# Patient Record
Sex: Male | Born: 1950 | Race: White | Hispanic: No | Marital: Married | Smoking: Current every day smoker
Health system: Southern US, Community
[De-identification: ages and names within clinical notes are randomized; demographics above are authoritative.]

## PROBLEM LIST (undated history)

## (undated) DIAGNOSIS — E785 Hyperlipidemia, unspecified: Secondary | ICD-10-CM

## (undated) DIAGNOSIS — I10 Essential (primary) hypertension: Secondary | ICD-10-CM

## (undated) DIAGNOSIS — I639 Cerebral infarction, unspecified: Secondary | ICD-10-CM

## (undated) HISTORY — DX: Hyperlipidemia, unspecified: E78.5

## (undated) HISTORY — PX: BACK SURGERY: SHX140

## (undated) HISTORY — DX: Cerebral infarction, unspecified: I63.9

---

## 2002-02-15 ENCOUNTER — Encounter: Admission: RE | Admit: 2002-02-15 | Discharge: 2002-02-15 | Payer: Self-pay | Admitting: Family Medicine

## 2002-02-15 ENCOUNTER — Encounter: Payer: Self-pay | Admitting: Family Medicine

## 2006-10-06 ENCOUNTER — Encounter: Admission: RE | Admit: 2006-10-06 | Discharge: 2006-10-06 | Payer: Self-pay | Admitting: Occupational Medicine

## 2008-10-04 ENCOUNTER — Encounter: Admission: RE | Admit: 2008-10-04 | Discharge: 2008-10-04 | Payer: Self-pay | Admitting: Occupational Medicine

## 2009-12-13 ENCOUNTER — Inpatient Hospital Stay (HOSPITAL_COMMUNITY): Admission: AD | Admit: 2009-12-13 | Discharge: 2009-12-14 | Payer: Self-pay | Admitting: Specialist

## 2009-12-14 ENCOUNTER — Encounter (INDEPENDENT_AMBULATORY_CARE_PROVIDER_SITE_OTHER): Payer: Self-pay | Admitting: Specialist

## 2010-12-16 LAB — URINE MICROSCOPIC-ADD ON

## 2010-12-16 LAB — CBC
Hemoglobin: 16.5 g/dL (ref 13.0–17.0)
MCV: 90.6 fL (ref 78.0–100.0)
RBC: 5.33 MIL/uL (ref 4.22–5.81)

## 2010-12-16 LAB — COMPREHENSIVE METABOLIC PANEL
ALT: 36 U/L (ref 0–53)
AST: 28 U/L (ref 0–37)
CO2: 29 mEq/L (ref 19–32)
Calcium: 9.6 mg/dL (ref 8.4–10.5)
Chloride: 95 mEq/L — ABNORMAL LOW (ref 96–112)
Glucose, Bld: 102 mg/dL — ABNORMAL HIGH (ref 70–99)
Potassium: 3.7 mEq/L (ref 3.5–5.1)
Sodium: 134 mEq/L — ABNORMAL LOW (ref 135–145)

## 2010-12-16 LAB — URINALYSIS, ROUTINE W REFLEX MICROSCOPIC
Glucose, UA: NEGATIVE mg/dL
Hgb urine dipstick: NEGATIVE
Ketones, ur: NEGATIVE mg/dL
Urobilinogen, UA: 0.2 mg/dL (ref 0.0–1.0)
pH: 7.5 (ref 5.0–8.0)

## 2012-05-02 ENCOUNTER — Encounter (HOSPITAL_COMMUNITY): Payer: Self-pay | Admitting: Anesthesiology

## 2012-05-02 ENCOUNTER — Encounter (HOSPITAL_COMMUNITY): Admission: EM | Disposition: A | Discharge status: Home / Self Care | Payer: Self-pay | Source: Home / Self Care

## 2012-05-02 ENCOUNTER — Emergency Department (HOSPITAL_COMMUNITY)
Admission: EM | Admit: 2012-05-02 | Discharge: 2012-05-02 | Disposition: A | Discharge status: Home / Self Care | Payer: BC Managed Care – PPO | Source: Home / Self Care | Attending: Emergency Medicine | Admitting: Emergency Medicine

## 2012-05-02 ENCOUNTER — Ambulatory Visit (HOSPITAL_COMMUNITY)
Admission: EM | Admit: 2012-05-02 | Discharge: 2012-05-02 | Disposition: A | Discharge status: Home / Self Care | Payer: BC Managed Care – PPO | Attending: Urology | Admitting: Urology

## 2012-05-02 ENCOUNTER — Emergency Department (HOSPITAL_COMMUNITY): Payer: BC Managed Care – PPO

## 2012-05-02 ENCOUNTER — Encounter (HOSPITAL_COMMUNITY): Payer: Self-pay | Admitting: *Deleted

## 2012-05-02 ENCOUNTER — Emergency Department (HOSPITAL_COMMUNITY): Payer: BC Managed Care – PPO | Admitting: Anesthesiology

## 2012-05-02 DIAGNOSIS — T190XXA Foreign body in urethra, initial encounter: Secondary | ICD-10-CM | POA: Insufficient documentation

## 2012-05-02 DIAGNOSIS — IMO0002 Reserved for concepts with insufficient information to code with codable children: Secondary | ICD-10-CM | POA: Insufficient documentation

## 2012-05-02 DIAGNOSIS — T191XXA Foreign body in bladder, initial encounter: Secondary | ICD-10-CM

## 2012-05-02 DIAGNOSIS — I10 Essential (primary) hypertension: Secondary | ICD-10-CM | POA: Insufficient documentation

## 2012-05-02 DIAGNOSIS — R310 Gross hematuria: Secondary | ICD-10-CM | POA: Insufficient documentation

## 2012-05-02 HISTORY — DX: Essential (primary) hypertension: I10

## 2012-05-02 HISTORY — PX: CYSTOSCOPY: SHX5120

## 2012-05-02 LAB — URINE MICROSCOPIC-ADD ON

## 2012-05-02 LAB — URINALYSIS, ROUTINE W REFLEX MICROSCOPIC
Bilirubin Urine: NEGATIVE
Glucose, UA: NEGATIVE mg/dL
Ketones, ur: NEGATIVE mg/dL
Leukocytes, UA: NEGATIVE
Nitrite: NEGATIVE
Protein, ur: NEGATIVE mg/dL
pH: 7 (ref 5.0–8.0)

## 2012-05-02 LAB — CBC WITH DIFFERENTIAL/PLATELET
Basophils Absolute: 0 10*3/uL (ref 0.0–0.1)
Eosinophils Absolute: 0.1 10*3/uL (ref 0.0–0.7)
Hemoglobin: 15.5 g/dL (ref 13.0–17.0)
MCH: 30.6 pg (ref 26.0–34.0)
Monocytes Absolute: 0.5 10*3/uL (ref 0.1–1.0)
Monocytes Relative: 4 % (ref 3–12)
Neutrophils Relative %: 84 % — ABNORMAL HIGH (ref 43–77)
Platelets: 278 10*3/uL (ref 150–400)
RBC: 5.06 MIL/uL (ref 4.22–5.81)
RDW: 13 % (ref 11.5–15.5)

## 2012-05-02 LAB — BASIC METABOLIC PANEL
BUN: 9 mg/dL (ref 6–23)
GFR calc Af Amer: >90 mL/min (ref >90)
GFR calc non Af Amer: >90 mL/min (ref >90)
Glucose, Bld: 99 mg/dL (ref 70–99)

## 2012-05-02 SURGERY — CYSTOSCOPY
Anesthesia: General | Site: Bladder | Wound class: Clean Contaminated

## 2012-05-02 MED ORDER — STERILE WATER FOR IRRIGATION IR SOLN
Status: DC | PRN
  Administered 2012-05-02: 3000 mL

## 2012-05-02 MED ORDER — LIDOCAINE HCL (CARDIAC) 20 MG/ML IV SOLN
INTRAVENOUS | Status: DC | PRN
  Administered 2012-05-02: 100 mg via INTRAVENOUS

## 2012-05-02 MED ORDER — PROMETHAZINE HCL 25 MG/ML IJ SOLN: 6.2500 mg | INTRAMUSCULAR | Status: DC | PRN

## 2012-05-02 MED ORDER — 0.9 % SODIUM CHLORIDE (POUR BTL) OPTIME
TOPICAL | Status: DC | PRN
  Administered 2012-05-02: 1000 mL

## 2012-05-02 MED ORDER — FENTANYL CITRATE 0.05 MG/ML IJ SOLN
INTRAMUSCULAR | Status: DC | PRN
  Administered 2012-05-02: 50 ug via INTRAVENOUS

## 2012-05-02 MED ORDER — PROPOFOL 10 MG/ML IV EMUL
INTRAVENOUS | Status: DC | PRN
  Administered 2012-05-02: 200 mg via INTRAVENOUS

## 2012-05-02 MED ORDER — FENTANYL CITRATE 0.05 MG/ML IJ SOLN: 25.0000 ug | INTRAMUSCULAR | Status: DC | PRN

## 2012-05-02 MED ORDER — LACTATED RINGERS IV SOLN: INTRAVENOUS | Status: DC

## 2012-05-02 MED ORDER — LACTATED RINGERS IV SOLN
INTRAVENOUS | Status: DC | PRN
  Administered 2012-05-02: 17:00:00 via INTRAVENOUS

## 2012-05-02 MED ORDER — SULFAMETHOXAZOLE-TRIMETHOPRIM 400-80 MG PO TABS: 1.0000 | ORAL_TABLET | Freq: Two times a day (BID) | ORAL | Status: AC

## 2012-05-02 MED ORDER — CEFAZOLIN SODIUM-DEXTROSE 2-3 GM-% IV SOLR
INTRAVENOUS | Status: AC
  Filled 2012-05-02: qty 50

## 2012-05-02 MED ORDER — TRAMADOL HCL 50 MG PO TABS: 50.0000 mg | ORAL_TABLET | Freq: Four times a day (QID) | ORAL | Status: AC | PRN

## 2012-05-02 MED ORDER — CEFAZOLIN SODIUM 1-5 GM-% IV SOLN
INTRAVENOUS | Status: DC | PRN
  Administered 2012-05-02: 2 g via INTRAVENOUS

## 2012-05-02 MED ORDER — MEPERIDINE HCL 50 MG/ML IJ SOLN: 6.2500 mg | INTRAMUSCULAR | Status: DC | PRN

## 2012-05-02 SURGICAL SUPPLY — 1 items: PACK CYSTO (CUSTOM PROCEDURE TRAY) ×2 IMPLANT

## 2012-05-02 NOTE — Anesthesia Preprocedure Evaluation (Signed)
Anesthesia Evaluation  Patient identified by MRN, date of birth, ID band Patient awake    Reviewed: Allergy & Precautions, H&P , NPO status , Patient's Chart, lab work & pertinent test results  Airway Mallampati: II TM Distance: >3 FB Neck ROM: Full    Dental No notable dental hx.    Pulmonary neg pulmonary ROS,  breath sounds clear to auscultation  Pulmonary exam normal       Cardiovascular hypertension, Pt. on medications negative cardio ROS  Rhythm:Regular Rate:Normal     Neuro/Psych negative neurological ROS  negative psych ROS   GI/Hepatic negative GI ROS, Neg liver ROS,   Endo/Other  negative endocrine ROS  Renal/GU negative Renal ROS  negative genitourinary   Musculoskeletal negative musculoskeletal ROS (+)   Abdominal   Peds negative pediatric ROS (+)  Hematology negative hematology ROS (+)   Anesthesia Other Findings   Reproductive/Obstetrics negative OB ROS                           Anesthesia Physical Anesthesia Plan  ASA: II  Anesthesia Plan: General   Post-op Pain Management:    Induction: Intravenous  Airway Management Planned: LMA  Additional Equipment:   Intra-op Plan:   Post-operative Plan: Extubation in OR  Informed Consent: I have reviewed the patients History and Physical, chart, labs and discussed the procedure including the risks, benefits and alternatives for the proposed anesthesia with the patient or authorized representative who has indicated his/her understanding and acceptance.   Dental advisory given  Plan Discussed with: CRNA  Anesthesia Plan Comments:         Anesthesia Quick Evaluation  

## 2012-05-02 NOTE — Anesthesia Postprocedure Evaluation (Signed)
  Anesthesia Post-op Note  Patient: Steven Hahn  Procedure(s) Performed: Procedure(s) (LRB): CYSTOSCOPY (N/A)  Patient Location: PACU  Anesthesia Type: General  Level of Consciousness: awake and alert   Airway and Oxygen Therapy: Patient Spontanous Breathing  Post-op Pain: mild  Post-op Assessment: Post-op Vital signs reviewed, Patient's Cardiovascular Status Stable, Respiratory Function Stable, Patent Airway and No signs of Nausea or vomiting  Post-op Vital Signs: stable  Complications: No apparent anesthesia complications

## 2012-05-02 NOTE — ED Notes (Signed)
Patient does not need anything at this time. 

## 2012-05-02 NOTE — ED Notes (Signed)
Pt arrived from [facility], Dr. Kathrynn Running in w/ pt to review procedure and get consent signed.

## 2012-05-02 NOTE — Transfer of Care (Signed)
Immediate Anesthesia Transfer of Care Note  Patient: Steven Hahn  Procedure(s) Performed: Procedure(s) (LRB): CYSTOSCOPY (N/A)  Patient Location: PACU  Anesthesia Type: General  Level of Consciousness: awake  Airway & Oxygen Therapy: Patient Spontanous Breathing and Patient connected to face mask oxygen  Post-op Assessment: Report given to PACU RN and Post -op Vital signs reviewed and stable  Post vital signs: Reviewed and stable  Complications: No apparent anesthesia complications

## 2012-05-02 NOTE — Preoperative (Signed)
Beta Blockers   Reason not to administer Beta Blockers:Not Applicable 

## 2012-05-02 NOTE — H&P (Signed)
Steven Hahn is an 61 y.o. male.   Chief Complaint: urethral / bladder foreign body HPI:   [name] who lodged foreign body into bladder via his urethra approximately 7 hours ago. Describes object as 6inch long by 1/8 inch diameter fishing lure. He has voided since placing the object with pink urine, no clots. Denies prior GU history or procedures.  PMH sig for HLD, HTN. Deneis CV disease or blood thinners.  Past Medical History  Diagnosis Date  . Hypertension     Past Surgical History  Procedure Date  . Back surgery     No family history on file. Social History:  reports that he has been smoking.  He does not have any smokeless tobacco history on file. He reports that he does not drink alcohol or use illicit drugs.  Allergies: No Known Allergies   (Not in a hospital admission)  Results for orders placed during the hospital encounter of 05/02/12 (from the past 48 hour(s))  CBC WITH DIFFERENTIAL     Status: Abnormal   Collection Time   05/02/12  2:43 PM      Component Value Range Comment   WBC 14.7 (*) 4.0 - 10.5 K/uL    RBC 5.06  4.22 - 5.81 MIL/uL    Hemoglobin 15.5  13.0 - 17.0 g/dL    HCT 45.4  09.8 - 11.9 %    MCV 85.6  78.0 - 100.0 fL    MCH 30.6  26.0 - 34.0 pg    MCHC 35.8  30.0 - 36.0 g/dL    RDW 14.7  82.9 - 56.2 %    Platelets 278  150 - 400 K/uL    Neutrophils Relative 84 (*) 43 - 77 %    Neutro Abs 12.4 (*) 1.7 - 7.7 K/uL    Lymphocytes Relative 12  12 - 46 %    Lymphs Abs 1.7  0.7 - 4.0 K/uL    Monocytes Relative 4  3 - 12 %    Monocytes Absolute 0.5  0.1 - 1.0 K/uL    Eosinophils Relative 1  0 - 5 %    Eosinophils Absolute 0.1  0.0 - 0.7 K/uL    Basophils Relative 0  0 - 1 %    Basophils Absolute 0.0  0.0 - 0.1 K/uL   BASIC METABOLIC PANEL     Status: Abnormal   Collection Time   05/02/12  2:43 PM      Component Value Range Comment   Sodium 130 (*) 135 - 145 mEq/L    Potassium 3.2 (*) 3.5 - 5.1 mEq/L    Chloride 94 (*) 96 - 112 mEq/L    CO2 25   19 - 32 mEq/L    Glucose, Bld 99  70 - 99 mg/dL    BUN 9  6 - 23 mg/dL    Creatinine, Ser 1.30  0.50 - 1.35 mg/dL    Calcium 9.4  8.4 - 86.5 mg/dL    GFR calc non Af Amer >90  >90 mL/min    GFR calc Af Amer >90  >90 mL/min   URINALYSIS, ROUTINE W REFLEX MICROSCOPIC     Status: Abnormal   Collection Time   05/02/12  2:56 PM      Component Value Range Comment   Color, Urine YELLOW  YELLOW    APPearance HAZY (*) CLEAR    Specific Gravity, Urine 1.020  1.005 - 1.030    pH 7.0  5.0 - 8.0  Glucose, UA NEGATIVE  NEGATIVE mg/dL    Hgb urine dipstick LARGE (*) NEGATIVE    Bilirubin Urine NEGATIVE  NEGATIVE    Ketones, ur NEGATIVE  NEGATIVE mg/dL    Protein, ur NEGATIVE  NEGATIVE mg/dL    Urobilinogen, UA 0.2  0.0 - 1.0 mg/dL    Nitrite NEGATIVE  NEGATIVE    Leukocytes, UA NEGATIVE  NEGATIVE   URINE MICROSCOPIC-ADD ON     Status: Normal   Collection Time   05/02/12  2:56 PM      Component Value Range Comment   WBC, UA 3-6  <3 WBC/hpf    RBC / HPF TOO NUMEROUS TO COUNT  <3 RBC/hpf    Bacteria, UA RARE  RARE    No results found.  Review of Systems  Constitutional: Negative.  Negative for fever and weight loss.  HENT: Negative.   Eyes: Negative.   Respiratory: Negative.   Cardiovascular: Negative.   Gastrointestinal: Negative.   Genitourinary: Positive for urgency and hematuria. Negative for flank pain.  Musculoskeletal: Negative.   Skin: Negative.   Neurological: Negative.   Endo/Heme/Allergies: Negative.   Psychiatric/Behavioral: Negative.     Blood pressure 144/87, pulse 55, temperature 98 F (36.7 C), temperature source Oral, resp. rate 16, SpO2 98.00%. Physical Exam  Constitutional: He appears well-developed and well-nourished.  HENT:  Head: Normocephalic and atraumatic.  Eyes: Pupils are equal, round, and reactive to light.  Neck: Normal range of motion. Neck supple.  Cardiovascular: Normal rate.   Respiratory: Effort normal.  GI: Soft. Bowel sounds are normal.        No scars, non-obese, no masses  Genitourinary: Penis normal.       No CVA or SP TTP  Musculoskeletal: Normal range of motion.  Neurological: He is alert.  Skin: Skin is warm and dry.  Psychiatric: He has a normal mood and affect. His behavior is normal. Judgment and thought content normal.     Assessment/Plan 1 - Bladder Foreign Body - we discussed risks of retained foreign body including nidus for infection, hematuria, nidus for stones, pain and options for treatment including attempted cystoscopic retrieval vs. Open cystotomy.  Pt has vacation planned tomorrow and wants attempted cystoscopic retrieval, if unable to remove, he will opt for open approach in the scheduled setting.  2 - Consented / Posted for cysto and retrieval of bladder foreign body. Risks including bleeding, infection, damage to bladder (perforation) discussed. Also emphasized that if not able to remove cystoscopically he will need open approach which I offered to him as a back-up plan today, but he refuses at this time.   Steven Hahn 05/02/2012, 4:37 PM

## 2012-05-02 NOTE — ED Notes (Signed)
Pt had attempted an "insertion", where he took a silicone fishing and placed it inside his penis and it went into his bladder, pt denies any pain,

## 2012-05-02 NOTE — Brief Op Note (Signed)
05/02/2012  5:48 PM  PATIENT:  Steven Hahn  61 y.o. male  PRE-OPERATIVE DIAGNOSIS:  foreign body in bladder or urethra  POST-OPERATIVE DIAGNOSIS:  foreign body in bladder  PROCEDURE:  Procedure(s) (LRB): CYSTOSCOPY with retrieval of foreign body  SURGEON:  Surgeon(s) and Role:    * Sebastian Ache, MD - Primary    ANESTHESIA:   general  EBL:   nil  BLOOD ADMINISTERED:none  DRAINS: none   LOCAL MEDICATIONS USED:  NONE  SPECIMEN:  Source of Specimen:  foriegn body (lure) retrieved intact and discarded. and Biopsy / Limited Resection  DISPOSITION OF SPECIMEN:  discard after photo documentation of retrieval  COUNTS:  YES  TOURNIQUET:  * No tourniquets in log *  DICTATION: .Other Dictation: Dictation Number [id number]  PLAN OF CARE: Discharge to home after PACU  PATIENT DISPOSITION:  PACU - hemodynamically stable.   Delay start of Pharmacological VTE agent (>24hrs) due to surgical blood loss or risk of bleeding: no

## 2012-05-02 NOTE — ED Notes (Signed)
Pt going POV to [facility] ER.

## 2012-05-02 NOTE — ED Provider Notes (Addendum)
History  This chart was scribed for Steven Jakes, MD by Steven Hahn. This patient was seen in room APA05/APA05 and the patient's care was started at 1:16PM.  CSN: [id number]  Arrival date & time 05/02/12  1203   First MD Initiated Contact with Patient 05/02/12 1316      Chief Complaint  Patient presents with  . Foreign Body     Patient is a 61 y.o. male presenting with foreign body. The history is provided by the patient. No language interpreter was used.  Foreign Body  The current episode started 3 to 5 hours ago. Intake: in the penis. Suspected object: silicone fish lure. The incident was witnessed/reported by the patient. Pertinent negatives include no chest pain, no fever, no abdominal pain, no vomiting and no congestion. He has received no recent medical care.    Steven Hahn is a 61 y.o. male who presents to the Emergency Department complaining of foreign body described as a 6 inch silicone fishing worm stuck in his penis that he inserted for sexual pleasure around 3 hours ago. He reports that he has been trying to retrieve the worm but has been unsuccessful. He believes that it is now "stuck in my bladder". He lists mild hematuria as an associated symptom. He reports that he is planning on leaving to go on vacation tomorrow and is requesting to get an antibiotic prescription and set up a follow up appointment with an urologist for when he gets back. He denies penile pain, difficulty urinating, fever, nausea, emesis and abdominal pain as associated symptoms. He has a h/o HTN. He is a current everyday smoker but denies alcohol use.   PCP is Dr. Carlos American with [facility].  Past Medical History  Diagnosis Date  . Hypertension     Past Surgical History  Procedure Date  . Back surgery     No family history on file.  History  Substance Use Topics  . Smoking status: Current Everyday Smoker  . Smokeless tobacco: Not on file  . Alcohol Use: No       Review of Systems  Constitutional: Negative for fever and chills.  HENT: Negative for congestion and neck pain.   Eyes: Negative for visual disturbance.  Respiratory: Negative for shortness of breath.   Cardiovascular: Negative for chest pain.  Gastrointestinal: Negative for nausea, vomiting, abdominal pain and diarrhea.  Genitourinary: Positive for hematuria.       Foreign body in penis  Musculoskeletal: Negative for back pain.  Skin: Negative for rash.  Neurological: Negative for headaches.    Allergies  Review of patient's allergies indicates no known allergies.  Home Medications   Current Outpatient Rx  Name Route Sig Dispense Refill  . IBUPROFEN 200 MG PO TABS Oral Take 600 mg by mouth every 6 (six) hours as needed. Pain    . LISINOPRIL-HYDROCHLOROTHIAZIDE 20-12.5 MG PO TABS Oral Take 1 tablet by mouth daily.    Marland Kitchen METOPROLOL SUCCINATE ER 50 MG PO TB24 Oral Take 50 mg by mouth daily. Take with or immediately following a meal.    . TAMSULOSIN HCL 0.4 MG PO CAPS Oral Take 0.4 mg by mouth daily.      Triage Vitals: BP 152/79  Pulse 75  Temp 98.3 F (36.8 C)  Resp 18  SpO2 98%  Physical Exam  Nursing note and vitals reviewed. Constitutional: He is oriented to person, place, and time. He appears well-developed and well-nourished. No distress.  HENT:  Head: Normocephalic and atraumatic.  Eyes: Conjunctivae and EOM are normal.  Neck: Neck supple. No tracheal deviation present.  Cardiovascular: Normal rate and regular rhythm.   Pulmonary/Chest: Effort normal and breath sounds normal. No respiratory distress.  Abdominal: Soft. Bowel sounds are normal. There is no tenderness.  Musculoskeletal: Normal range of motion. He exhibits no edema.  Neurological: He is alert and oriented to person, place, and time. No cranial nerve deficit.  Skin: Skin is warm and dry.  Psychiatric: He has a normal mood and affect. His behavior is normal.    ED Course  Procedures  (including critical care time)  DIAGNOSTIC STUDIES: Oxygen Saturation is 98% on room air, normal by my interpretation.    COORDINATION OF CARE: 1:28PM-Discussed treatment plan which includes a consult with an urologist with pt at bedside and pt agreed to plan. 2:06PM-Discussed pt's case with Dr. Kathrynn Running and he wants to see the pt today due to risk of infection. He is requesting that the pt be sent to [facility] ED for further evaluation. Will send pt by his own transportation since this does not equal a medical emergency. Call ended at 2:10PM. 2:11PM-Informed pt of plan and he agreed to plan.   Labs Reviewed - No data to display No results found.   1. Foreign body in bladder       MDM  Discussed with on-call urology at [facility] long Dr. Berneice Heinrich, he is accepted the patient by POV to be seen at [facility] long ED and taken to the cystoscopy room for removal of the bladder foreign body. Patient is stable in no acute distress very capable of taking himself to the emergency department by POV. Patient last had something to your drink at 8:00 this morning.      I personally performed the services described in this documentation, which was scribed in my presence. The recorded information has been reviewed and considered.     Steven Jakes, MD 05/02/12 1439  Steven Jakes, MD 05/02/12 2134

## 2012-05-02 NOTE — ED Notes (Signed)
Discharge instructions reviewed with pt, questions answered. Pt verbalized understanding.  

## 2012-05-03 ENCOUNTER — Encounter (HOSPITAL_COMMUNITY): Payer: Self-pay | Admitting: Urology

## 2012-05-03 NOTE — Op Note (Signed)
Steven Hahn, RAYMAN NO.:  [id number]  MEDICAL RECORD NO.:  [id number]  LOCATION:                               FACILITY:  [facility]  PHYSICIAN:  Sebastian Ache, MD     DATE OF BIRTH:  [birth year]  DATE OF PROCEDURE:  05/02/2012 DATE OF DISCHARGE:                              OPERATIVE REPORT   DIAGNOSIS:  Bladder, foreign body.  PROCEDURE:  Cystoscopy with retrieval of foreign body.  ESTIMATED BLOOD LOSS:  Nil.  DRAINS:  None.  FINDINGS: 1. Foreign body consistent with fishing lure in urinary bladder. 2. Otherwise unremarkable bladder and urethra.  SPECIMEN:  Bladder foreign body for discard after photodocumentation of retrieval.  COMPLICATIONS:  None.  INDICATIONS:  Mr. Zerbe is a 61 year old gentleman who lost a foreign body in his bladder earlier today, for which, he presented to [facility].  He was subsequently transferred here for urologic care. Risks of retaining the foreign body including the risk of infection, hematuria and pain were discussed and remainder of the options including cystoscopic retrieval versus open retrieval were discussed and the patient wished to proceed with an attempted cystoscopic retrieval. Informed consent was obtained and placed in the medical record.  PROCEDURE IN DETAIL:  The patient being, Ashely Joshua, verified; procedure being cystoscopy of foreign body retrieval was confirmed.  The procedure was carried out.  Time-out was performed.  Intravenous antibiotics were administered.  General LMA anesthesia was introduced. The patient was placed in a low lithotomy position.  Sterile field was created by prepping and draping of the patient's penis, perineum, and proximal thighs using iodine x3.  Next, cystourethroscopy was performed using a 26-French rigid cystoscope with 12-degree lens.  Inspection of the anterior and posterior urethra were unremarkable.  Inspection of the bladder revealed a foreign body  consistent with a fishing lure in the urinary bladder.  There was mild tuberculation.  There were no other foreign bodies encountered.  Ureteral orifices were in the normal anatomic position, but the efflux was clear bilaterally.  No diverticular, calcifications, papillary lesions were noted.  Next, gaspers were used to grasp the foreign body on its long axis and this was removed entirely in side to side for discard after photodocumentation of its retrieval.  Repeat cystoscopic examination revealed no residual foreign body, no evidence of damage to the urethra or bladder. Bladder was emptied per cystoscope.  The procedure was then terminated. The patient tolerated the procedure well.  There were no immediate periprocedural complications.  The patient was taken to the postanesthesia care unit in stable condition.          ______________________________ Sebastian Ache, MD     TM/MEDQ  D:  05/02/2012  T:  05/03/2012  Job:  [id number]

## 2013-10-19 ENCOUNTER — Ambulatory Visit: Payer: Self-pay

## 2013-10-19 ENCOUNTER — Other Ambulatory Visit: Payer: Self-pay | Admitting: Occupational Medicine

## 2013-10-19 DIAGNOSIS — Z0000 Encounter for general adult medical examination without abnormal findings: Secondary | ICD-10-CM

## 2013-12-03 ENCOUNTER — Emergency Department (HOSPITAL_COMMUNITY)
Admission: EM | Admit: 2013-12-03 | Discharge: 2013-12-04 | Disposition: A | Discharge status: Home / Self Care | Payer: BC Managed Care – PPO | Attending: Emergency Medicine | Admitting: Emergency Medicine

## 2013-12-03 ENCOUNTER — Encounter (HOSPITAL_COMMUNITY): Payer: Self-pay | Admitting: Emergency Medicine

## 2013-12-03 ENCOUNTER — Emergency Department (HOSPITAL_COMMUNITY): Payer: BC Managed Care – PPO

## 2013-12-03 DIAGNOSIS — Z79899 Other long term (current) drug therapy: Secondary | ICD-10-CM | POA: Insufficient documentation

## 2013-12-03 DIAGNOSIS — M2550 Pain in unspecified joint: Secondary | ICD-10-CM | POA: Insufficient documentation

## 2013-12-03 DIAGNOSIS — J209 Acute bronchitis, unspecified: Secondary | ICD-10-CM | POA: Insufficient documentation

## 2013-12-03 DIAGNOSIS — J40 Bronchitis, not specified as acute or chronic: Secondary | ICD-10-CM

## 2013-12-03 DIAGNOSIS — I10 Essential (primary) hypertension: Secondary | ICD-10-CM | POA: Insufficient documentation

## 2013-12-03 DIAGNOSIS — F17200 Nicotine dependence, unspecified, uncomplicated: Secondary | ICD-10-CM | POA: Insufficient documentation

## 2013-12-03 MED ORDER — AZITHROMYCIN 250 MG PO TABS
500.0000 mg | ORAL_TABLET | Freq: Once | ORAL | Status: AC
  Administered 2013-12-03: 500 mg via ORAL
  Filled 2013-12-03: qty 2

## 2013-12-03 MED ORDER — AZITHROMYCIN 250 MG PO TABS
ORAL_TABLET | ORAL | Status: DC
Start: 2013-12-03 — End: 2015-04-03

## 2013-12-03 MED ORDER — GUAIFENESIN-CODEINE 100-10 MG/5ML PO SOLN
10.0000 mL | Freq: Once | ORAL | Status: AC
  Administered 2013-12-03: 10 mL via ORAL
  Filled 2013-12-03: qty 10

## 2013-12-03 MED ORDER — ALBUTEROL SULFATE HFA 108 (90 BASE) MCG/ACT IN AERS
2.0000 | INHALATION_SPRAY | Freq: Once | RESPIRATORY_TRACT | Status: AC
  Administered 2013-12-04: 2 via RESPIRATORY_TRACT
  Filled 2013-12-03: qty 6.7

## 2013-12-03 MED ORDER — ALBUTEROL SULFATE HFA 108 (90 BASE) MCG/ACT IN AERS: 1.0000 | INHALATION_SPRAY | Freq: Four times a day (QID) | RESPIRATORY_TRACT | Status: DC | PRN

## 2013-12-03 MED ORDER — HYDROCODONE-HOMATROPINE 5-1.5 MG/5ML PO SYRP: 5.0000 mL | ORAL_SOLUTION | Freq: Four times a day (QID) | ORAL | Status: DC | PRN

## 2013-12-03 MED ORDER — IPRATROPIUM-ALBUTEROL 0.5-2.5 (3) MG/3ML IN SOLN
3.0000 mL | Freq: Once | RESPIRATORY_TRACT | Status: AC
  Administered 2013-12-03: 3 mL via RESPIRATORY_TRACT
  Filled 2013-12-03: qty 3

## 2013-12-03 NOTE — ED Notes (Signed)
Cough, congestion for 3 days.  

## 2013-12-03 NOTE — ED Provider Notes (Signed)
CSN: [id number]     Arrival date & time 12/03/13  2050 History   First MD Initiated Contact with Patient 12/03/13 2202     Chief Complaint  Patient presents with  . URI     (Consider location/radiation/quality/duration/timing/severity/associated sxs/prior Treatment) Patient is a 63 y.o. male presenting with URI. The history is provided by the patient.  URI Presenting symptoms: congestion and cough   Severity:  Moderate Duration:  3 days Timing:  Constant Progression:  Worsening Chronicity:  New Relieved by:  Nothing Worsened by:  Nothing tried Ineffective treatments:  None tried Associated symptoms: arthralgias   Risk factors: being elderly   Risk factors: no sick contacts     Past Medical History  Diagnosis Date  . Hypertension    Past Surgical History  Procedure Laterality Date  . Back surgery    . Cystoscopy  05/02/2012    Procedure: CYSTOSCOPY;  Surgeon: Sebastian Ache, MD;  Location: [facility] ORS;  Service: Urology;  Laterality: N/A;  removal of foreign body   No family history on file. History  Substance Use Topics  . Smoking status: Current Every Day Smoker  . Smokeless tobacco: Not on file  . Alcohol Use: No    Review of Systems  HENT: Positive for congestion.   Respiratory: Positive for cough.   Musculoskeletal: Positive for arthralgias.  All other systems reviewed and are negative.      Allergies  Review of patient's allergies indicates no known allergies.  Home Medications   Current Outpatient Rx  Name  Route  Sig  Dispense  Refill  . lisinopril-hydrochlorothiazide (PRINZIDE,ZESTORETIC) 20-12.5 MG per tablet   Oral   Take 1 tablet by mouth daily.         . metoprolol succinate (TOPROL-XL) 50 MG 24 hr tablet   Oral   Take 50 mg by mouth daily. Take with or immediately following a meal.         . pravastatin (PRAVACHOL) 10 MG tablet   Oral   Take 10 mg by mouth daily.         . Tamsulosin HCl (FLOMAX) 0.4 MG CAPS   Oral   Take 0.4 mg  by mouth daily after supper.           BP 111/62  Pulse 67  Temp(Src) 99.3 F (37.4 C) (Oral)  Resp 20  Ht 5\' 8"  (1.727 m)  Wt 148 lb (67.132 kg)  BMI 22.51 kg/m2  SpO2 95% Physical Exam  Nursing note and vitals reviewed. Constitutional: He is oriented to person, place, and time. He appears well-developed and well-nourished.  HENT:  Head: Normocephalic and atraumatic.  Right Ear: External ear normal.  Left Ear: External ear normal.  Mouth/Throat: Oropharynx is clear and moist.  rhinorhea  Eyes: Conjunctivae are normal. Pupils are equal, round, and reactive to light.  Neck: Normal range of motion. Neck supple.  Cardiovascular: Normal rate and normal heart sounds.   Pulmonary/Chest: Effort normal. He exhibits tenderness.  Abdominal: Soft. Bowel sounds are normal.  Musculoskeletal: Normal range of motion.  Neurological: He is alert and oriented to person, place, and time.  Skin: Skin is warm.  Psychiatric: He has a normal mood and affect.    ED Course  Procedures (including critical care time) Labs Review Labs Reviewed - No data to display Imaging Review No results found.   EKG Interpretation None      MDM Chest xray no pneumonia     Pt given albuterol and atrovent neb.  I will treat with zithromax, albuterol and hydromet for cough     Final diagnoses:  Bronchitis        Elson AreasLeslie K Dannah Ryles, PA-C 12/03/13 2310

## 2013-12-03 NOTE — ED Provider Notes (Signed)
Medical screening examination/treatment/procedure(s) were performed by non-physician practitioner and as supervising physician I was immediately available for consultation/collaboration.   EKG Interpretation None        Ayvin Lipinski B. Bernette MayersSheldon, MD 12/03/13 [phone]

## 2013-12-03 NOTE — Discharge Instructions (Signed)

## 2015-04-02 ENCOUNTER — Telehealth: Payer: Self-pay

## 2015-04-02 NOTE — Telephone Encounter (Signed)
Patient is here while his wife is being seen as a patient and wanted to be triaged while he was here with her. DS is aware and will get to him after his wife is discharged from being seen today.

## 2015-04-02 NOTE — Telephone Encounter (Signed)
I triaged and pt is scheduled for colonoscopy with Dr. Jena Gaussourk on 04/30/2015 at 10:30 Am.

## 2015-04-03 ENCOUNTER — Other Ambulatory Visit: Payer: Self-pay

## 2015-04-03 DIAGNOSIS — Z1211 Encounter for screening for malignant neoplasm of colon: Secondary | ICD-10-CM

## 2015-04-03 NOTE — Addendum Note (Signed)
Addended by: Tiffany KocherLEWIS, Anselma Herbel S on: 04/03/2015 12:07 PM   Modules accepted: Orders, Medications

## 2015-04-03 NOTE — Telephone Encounter (Addendum)
Gastroenterology Pre-Procedure Review  Request Date: 04/03/2015 Requesting Physician: Dr. Juanetta GoslingHawkins  PATIENT REVIEW QUESTIONS: The patient responded to the following health history questions as indicated:    Pt said [place]  1. Diabetes Melitis: no 2. Joint replacements in the past 12 months: no 3. Major health problems in the past 3 months: no 4. Has an artificial valve or MVP: no 5. Has a defibrillator: no 6. Has been advised in past to take antibiotics in advance of a procedure like teeth cleaning: no    MEDICATIONS & ALLERGIES:    Patient reports the following regarding taking any blood thinners:   Plavix? no Aspirin? YES Coumadin? no  Patient confirms/reports the following medications:  Current Outpatient Prescriptions  Medication Sig Dispense Refill  . aspirin 81 MG tablet Take 81 mg by mouth daily.    Marland Kitchen. lisinopril-hydrochlorothiazide (PRINZIDE,ZESTORETIC) 20-12.5 MG per tablet Take 1 tablet by mouth daily.    . metoprolol succinate (TOPROL-XL) 50 MG 24 hr tablet Take 50 mg by mouth daily. Take with or immediately following a meal.    . pravastatin (PRAVACHOL) 10 MG tablet Take 10 mg by mouth daily.    . Tamsulosin HCl (FLOMAX) 0.4 MG CAPS Take 0.4 mg by mouth daily after supper.      No current facility-administered medications for this visit.    Patient confirms/reports the following allergies:  No Known Allergies  No orders of the defined types were placed in this encounter.    AUTHORIZATION INFORMATION Primary Insurance:   ID #:  Group #:  Pre-Cert / Auth required: Pre-Cert / Auth #:   Secondary Insurance:   ID #:  Group #:  Pre-Cert / Auth required: Pre-Cert / Auth #:   SCHEDULE INFORMATION: Procedure has been scheduled as follows:  Date: 04/30/2015             Time: 10:30 AM  [facility] Short Stay  This Gastroenterology Pre-Precedure Review Form is being routed to the  following provider(s): R. Roetta SessionsMichael Rourk, MD

## 2015-04-03 NOTE — Telephone Encounter (Signed)
OK to schedule

## 2015-04-04 MED ORDER — PEG-KCL-NACL-NASULF-NA ASC-C 100 G PO SOLR: 1.0000 | ORAL | Status: DC

## 2015-04-04 NOTE — Telephone Encounter (Signed)
Rx sent to the pharmacy and instructions mailed to pt.  

## 2015-04-04 NOTE — Addendum Note (Signed)
Addended by: Lavena BullionSTEWART, Goldye Tourangeau H on: 04/04/2015 09:07 AM   Modules accepted: Orders

## 2015-04-24 ENCOUNTER — Telehealth: Payer: Self-pay

## 2015-04-24 NOTE — Telephone Encounter (Signed)
LMOM for pt to call. I need phone number on back of insurance care to call for PA.

## 2015-04-24 NOTE — Telephone Encounter (Signed)
I called [employer] at [phone] and got the automation. Pa not required for the screening colonoscopy as out patient with participating providers.

## 2015-04-25 ENCOUNTER — Telehealth: Payer: Self-pay

## 2015-04-25 NOTE — Telephone Encounter (Signed)
I called and LMOM for pt to return call and let me know if he has had any change in meds since he was triaged.

## 2015-04-30 ENCOUNTER — Encounter (HOSPITAL_COMMUNITY)
Admission: RE | Disposition: A | Discharge status: Home / Self Care | Payer: Self-pay | Source: Ambulatory Visit | Attending: Internal Medicine

## 2015-04-30 ENCOUNTER — Encounter (HOSPITAL_COMMUNITY): Payer: Self-pay

## 2015-04-30 ENCOUNTER — Ambulatory Visit (HOSPITAL_COMMUNITY)
Admission: RE | Admit: 2015-04-30 | Discharge: 2015-04-30 | Disposition: A | Discharge status: Home / Self Care | Payer: Medicare HMO | Source: Ambulatory Visit | Attending: Internal Medicine | Admitting: Internal Medicine

## 2015-04-30 DIAGNOSIS — Z1211 Encounter for screening for malignant neoplasm of colon: Secondary | ICD-10-CM | POA: Diagnosis not present

## 2015-04-30 DIAGNOSIS — Z79899 Other long term (current) drug therapy: Secondary | ICD-10-CM | POA: Diagnosis not present

## 2015-04-30 DIAGNOSIS — Z7982 Long term (current) use of aspirin: Secondary | ICD-10-CM | POA: Diagnosis not present

## 2015-04-30 DIAGNOSIS — F17200 Nicotine dependence, unspecified, uncomplicated: Secondary | ICD-10-CM | POA: Diagnosis not present

## 2015-04-30 DIAGNOSIS — I10 Essential (primary) hypertension: Secondary | ICD-10-CM | POA: Diagnosis not present

## 2015-04-30 DIAGNOSIS — K5730 Diverticulosis of large intestine without perforation or abscess without bleeding: Secondary | ICD-10-CM | POA: Diagnosis not present

## 2015-04-30 HISTORY — PX: COLONOSCOPY: SHX5424

## 2015-04-30 SURGERY — COLONOSCOPY
Anesthesia: Moderate Sedation

## 2015-04-30 MED ORDER — MEPERIDINE HCL 100 MG/ML IJ SOLN
INTRAMUSCULAR | Status: DC | PRN
  Administered 2015-04-30: 25 mg via INTRAVENOUS
  Administered 2015-04-30: 50 mg via INTRAVENOUS
  Administered 2015-04-30: 25 mg via INTRAVENOUS

## 2015-04-30 MED ORDER — MIDAZOLAM HCL 5 MG/5ML IJ SOLN
INTRAMUSCULAR | Status: DC | PRN
Start: 2015-04-30 — End: 2015-04-30
  Administered 2015-04-30 (×2): 2 mg via INTRAVENOUS
  Administered 2015-04-30: 1 mg via INTRAVENOUS

## 2015-04-30 MED ORDER — SODIUM CHLORIDE 0.9 % IV SOLN
INTRAVENOUS | Status: DC
  Administered 2015-04-30: 10:00:00 via INTRAVENOUS

## 2015-04-30 MED ORDER — ONDANSETRON HCL 4 MG/2ML IJ SOLN
INTRAMUSCULAR | Status: DC | PRN
  Administered 2015-04-30: 4 mg via INTRAVENOUS

## 2015-04-30 MED ORDER — ONDANSETRON HCL 4 MG/2ML IJ SOLN
INTRAMUSCULAR | Status: AC
  Filled 2015-04-30: qty 2

## 2015-04-30 MED ORDER — MIDAZOLAM HCL 5 MG/5ML IJ SOLN
INTRAMUSCULAR | Status: AC
  Filled 2015-04-30: qty 10

## 2015-04-30 MED ORDER — STERILE WATER FOR IRRIGATION IR SOLN
Status: DC | PRN
  Administered 2015-04-30: 11:00:00

## 2015-04-30 MED ORDER — MEPERIDINE HCL 100 MG/ML IJ SOLN
INTRAMUSCULAR | Status: AC
  Filled 2015-04-30: qty 2

## 2015-04-30 NOTE — Telephone Encounter (Signed)
Pt left VM that there has not been any changes in his meds since he was triaged for the colonoscopy.

## 2015-04-30 NOTE — Op Note (Signed)
[facility] [address] [place], [address]   COLONOSCOPY PROCEDURE REPORT  PATIENT: Steven, Hahn  MR#: [id number] BIRTHDATE: 24-Mar-1951 , 63  yrs. old GENDER: male ENDOSCOPIST: R.  Roetta Sessions, MD FACP [facility] REFERRED XB:JYNWGN Juanetta Gosling, M.D. PROCEDURE DATE:  2015/05/11 PROCEDURE:   Colonoscopy, screening INDICATIONS:Average risk colorectal cancer screening examination. MEDICATIONS: Versed 5 mg IV and Demerol 100 mg IV in divided doses. Zofran 4 mg IV. ASA CLASS:       Class II  CONSENT: The risks, benefits, alternatives and imponderables including but not limited to bleeding, perforation as well as the possibility of a missed lesion have been reviewed.  The potential for biopsy, lesion removal, etc. have also been discussed. Questions have been answered.  All parties agreeable.  Please see the history and physical in the medical record for more information.  DESCRIPTION OF PROCEDURE:   After the risks benefits and alternatives of the procedure were thoroughly explained, informed consent was obtained.  The digital rectal exam revealed no abnormalities of the rectum.   The EC-[id number] ([id number])  endoscope was introduced through the anus and advanced to the cecum, which was identified by both the appendix and ileocecal valve. No adverse events experienced.   The quality of the prep was adequate  The instrument was then slowly withdrawn as the colon was fully examined. Estimated blood loss is zero unless otherwise noted in this procedure report.      COLON FINDINGS: Normal-appearing rectal mucosa.  Scattered left-sided diverticula; the remainder of the colonic mucosa appeared normal.  Retroflexion was performed. .  Withdrawal time=9 minutes 0 seconds.  The scope was withdrawn and the procedure completed. COMPLICATIONS: There were no immediate complications.  ENDOSCOPIC IMPRESSION: Colonic diverticulosis  RECOMMENDATIONS: 1 more screening  colonoscopy in 10 years  eSigned:  R. Roetta Sessions, MD Jerrel Ivory [facility] 05/11/15 11:29 AM   cc:  CPT CODES: ICD CODES:  The ICD and CPT codes recommended by this software are interpretations from the data that the clinical staff has captured with the software.  The verification of the translation of this report to the ICD and CPT codes and modifiers is the sole responsibility of the health care institution and practicing physician where this report was generated.  [employer] will not be held responsible for the validity of the ICD and CPT codes included on this report.  AMA assumes no liability for data contained or not contained herein. CPT is a Publishing rights manager of the [employer].  PATIENT NAME:  Steven, Hahn MR#: [id number]

## 2015-04-30 NOTE — Telephone Encounter (Signed)
Noted. Ok to schedule. 

## 2015-04-30 NOTE — Discharge Instructions (Signed)
°Colonoscopy °Discharge Instructions ° °Read the instructions outlined below and refer to this sheet in the next few weeks. These discharge instructions provide you with general information on caring for yourself after you leave the hospital. Your doctor may also give you specific instructions. While your treatment has been planned according to the most current medical practices available, unavoidable complications occasionally occur. If you have any problems or questions after discharge, call Dr. Rourk at [phone]. °ACTIVITY °· You may resume your regular activity, but move at a slower pace for the next 24 hours.  °· Take frequent rest periods for the next 24 hours.  °· Walking will help get rid of the air and reduce the bloated feeling in your belly (abdomen).  °· No driving for 24 hours (because of the medicine (anesthesia) used during the test).   °· Do not sign any important legal documents or operate any machinery for 24 hours (because of the anesthesia used during the test).  °NUTRITION °· Drink plenty of fluids.  °· You may resume your normal diet as instructed by your doctor.  °· Begin with a light meal and progress to your normal diet. Heavy or fried foods are harder to digest and may make you feel sick to your stomach (nauseated).  °· Avoid alcoholic beverages for 24 hours or as instructed.  °MEDICATIONS °· You may resume your normal medications unless your doctor tells you otherwise.  °WHAT YOU CAN EXPECT TODAY °· Some feelings of bloating in the abdomen.  °· Passage of more gas than usual.  °· Spotting of blood in your stool or on the toilet paper.  °IF YOU HAD POLYPS REMOVED DURING THE COLONOSCOPY: °· No aspirin products for 7 days or as instructed.  °· No alcohol for 7 days or as instructed.  °· Eat a soft diet for the next 24 hours.  °FINDING OUT THE RESULTS OF YOUR TEST °Not all test results are available during your visit. If your test results are not back during the visit, make an appointment  with your caregiver to find out the results. Do not assume everything is normal if you have not heard from your caregiver or the medical facility. It is important for you to follow up on all of your test results.  °SEEK IMMEDIATE MEDICAL ATTENTION IF: °· You have more than a spotting of blood in your stool.  °· Your belly is swollen (abdominal distention).  °· You are nauseated or vomiting.  °· You have a temperature over 101.  °· You have abdominal pain or discomfort that is severe or gets worse throughout the day.  ° ° °Diverticulosis information provided ° °Repeat screening colonoscopy in 10 years ° °Diverticulosis °Diverticulosis is the condition that develops when small pouches (diverticula) form in the wall of your colon. Your colon, or large intestine, is where water is absorbed and stool is formed. The pouches form when the inside layer of your colon pushes through weak spots in the outer layers of your colon. °CAUSES  °No one knows exactly what causes diverticulosis. °RISK FACTORS °· Being older than 50. Your risk for this condition increases with age. Diverticulosis is rare in people younger than 40 years. By age 80, almost everyone has it. °· Eating a low-fiber diet. °· Being frequently constipated. °· Being overweight. °· Not getting enough exercise. °· Smoking. °· Taking over-the-counter pain medicines, like aspirin and ibuprofen. °SYMPTOMS  °Most people with diverticulosis do not have symptoms. °DIAGNOSIS  °Because diverticulosis often has no symptoms,   health care providers often discover the condition during an exam for other colon problems. In many cases, a health care provider will diagnose diverticulosis while using a flexible scope to examine the colon (colonoscopy). °TREATMENT  °If you have never developed an infection related to diverticulosis, you may not need treatment. If you have had an infection before, treatment may include: °· Eating more fruits, vegetables, and grains. °· Taking a fiber  supplement. °· Taking a live bacteria supplement (probiotic). °· Taking medicine to relax your colon. °HOME CARE INSTRUCTIONS  °· Drink at least 6-8 glasses of water each day to prevent constipation. °· Try not to strain when you have a bowel movement. °· Keep all follow-up appointments. °If you have had an infection before:  °· Increase the fiber in your diet as directed by your health care provider or dietitian. °· Take a dietary fiber supplement if your health care provider approves. °· Only take medicines as directed by your health care provider. °SEEK MEDICAL CARE IF:  °· You have abdominal pain. °· You have bloating. °· You have cramps. °· You have not gone to the bathroom in 3 days. °SEEK IMMEDIATE MEDICAL CARE IF:  °· Your pain gets worse. °· Your bloating becomes very bad. °· You have a fever or chills, and your symptoms suddenly get worse. °· You begin vomiting. °· You have bowel movements that are bloody or black. °MAKE SURE YOU: °· Understand these instructions. °· Will watch your condition. °· Will get help right away if you are not doing well or get worse. °Document Released: 06/05/2004 Document Revised: 09/13/2013 Document Reviewed: 08/03/2013 °ExitCare® Patient Information ©2015 [employer]. This information is not intended to replace advice given to you by your health care provider. Make sure you discuss any questions you have with your health care provider. ° °

## 2015-04-30 NOTE — H&P (Signed)
_0 @   Primary Care Physician:  Alonza Bogus, MD Primary Gastroenterologist:  Dr. Gala Romney  Pre-Procedure History & Physical: HPI:  Steven Hahn is a 64 y.o. male is here for a screening colonoscopy. Reported negative colonoscopy in [place] 10 years ago. No bowel symptoms.  Past Medical History  Diagnosis Date  . Hypertension     Past Surgical History  Procedure Laterality Date  . Back surgery    . Cystoscopy  05/02/2012    Procedure: CYSTOSCOPY;  Surgeon: Alexis Frock, MD;  Location: [facility];  Service: Urology;  Laterality: N/A;  removal of foreign body    Prior to Admission medications   Medication Sig Start Date End Date Taking? Authorizing Provider  aspirin 81 MG tablet Take 81 mg by mouth daily.   Yes Historical Provider, MD  lisinopril-hydrochlorothiazide (PRINZIDE,ZESTORETIC) 20-25 MG per tablet Take 1 tablet by mouth daily. 02/10/15  Yes Historical Provider, MD  metoprolol succinate (TOPROL-XL) 50 MG 24 hr tablet Take 50 mg by mouth daily. Take with or immediately following a meal.   Yes Historical Provider, MD  peg 3350 powder (MOVIPREP) 100 G SOLR Take 1 kit (200 g total) by mouth as directed. 04/04/15  Yes Daneil Dolin, MD  pravastatin (PRAVACHOL) 10 MG tablet Take 10 mg by mouth daily.   Yes Historical Provider, MD  Tamsulosin HCl (FLOMAX) 0.4 MG CAPS Take 0.4 mg by mouth daily after supper.    Yes Historical Provider, MD    Allergies as of 04/03/2015  . (No Known Allergies)    History reviewed. No pertinent family history.  History   Social History  . Marital Status: Married    Spouse Name: N/A  . Number of Children: N/A  . Years of Education: N/A   Occupational History  . Not on file.   Social History Main Topics  . Smoking status: Current Every Day Smoker  . Smokeless tobacco: Not on file  . Alcohol Use: No  . Drug Use: No  . Sexual Activity: Not on file   Other Topics Concern  . Not on file   Social History Narrative    Review  of Systems: See HPI, otherwise negative ROS  Physical Exam: BP 145/82 mmHg  Pulse 92  Temp(Src) 98.1 F (36.7 C) (Oral)  Resp 16  Ht 5' 8" (1.727 m)  Wt 145 lb (65.772 kg)  BMI 22.05 kg/m2  SpO2 98% General:   Alert,  Well-developed, well-nourished, pleasant and cooperative in NAD Head:  Normocephalic and atraumatic. Eyes:  Sclera clear, no icterus.   Conjunctiva pink. Ears:  Normal auditory acuity. Nose:  No deformity, discharge,  or lesions. Mouth:  No deformity or lesions, dentition normal. Neck:  Supple; no masses or thyromegaly. Lungs:  Clear throughout to auscultation.   No wheezes, crackles, or rhonchi. No acute distress. Heart:  Regular rate and rhythm; no murmurs, clicks, rubs,  or gallops. Abdomen:  Soft, nontender and nondistended. No masses, hepatosplenomegaly or hernias noted. Normal bowel sounds, without guarding, and without rebound.   Msk:  Symmetrical without gross deformities. Normal posture. Pulses:  Normal pulses noted. Extremities:  Without clubbing or edema.   Impression/Plan: Steven Hahn is now here to undergo a screening colonoscopy.  Average risk screening examination  Risks, benefits, limitations, imponderables and alternatives regarding colonoscopy have been reviewed with the patient. Questions have been answered. All parties agreeable.     Notice:  This dictation was prepared with Dragon dictation along with smaller phrase technology. Any transcriptional errors  that result from this process are unintentional and may not be corrected upon review.

## 2015-05-01 ENCOUNTER — Encounter (HOSPITAL_COMMUNITY): Payer: Self-pay | Admitting: Internal Medicine

## 2015-08-23 DIAGNOSIS — Z23 Encounter for immunization: Secondary | ICD-10-CM | POA: Diagnosis not present

## 2015-08-23 DIAGNOSIS — I10 Essential (primary) hypertension: Secondary | ICD-10-CM | POA: Diagnosis not present

## 2015-08-23 DIAGNOSIS — N429 Disorder of prostate, unspecified: Secondary | ICD-10-CM | POA: Diagnosis not present

## 2015-08-23 DIAGNOSIS — E785 Hyperlipidemia, unspecified: Secondary | ICD-10-CM | POA: Diagnosis not present

## 2015-10-04 ENCOUNTER — Other Ambulatory Visit: Payer: Self-pay | Admitting: Occupational Medicine

## 2015-10-04 ENCOUNTER — Ambulatory Visit: Payer: Self-pay

## 2015-10-04 DIAGNOSIS — Z0000 Encounter for general adult medical examination without abnormal findings: Secondary | ICD-10-CM

## 2016-02-21 DIAGNOSIS — E789 Disorder of lipoprotein metabolism, unspecified: Secondary | ICD-10-CM | POA: Diagnosis not present

## 2016-02-21 DIAGNOSIS — I10 Essential (primary) hypertension: Secondary | ICD-10-CM | POA: Diagnosis not present

## 2016-02-21 DIAGNOSIS — M429 Spinal osteochondrosis, unspecified: Secondary | ICD-10-CM | POA: Diagnosis not present

## 2016-03-11 DIAGNOSIS — M542 Cervicalgia: Secondary | ICD-10-CM | POA: Diagnosis not present

## 2016-03-11 DIAGNOSIS — M5031 Other cervical disc degeneration,  high cervical region: Secondary | ICD-10-CM | POA: Diagnosis not present

## 2016-04-01 DIAGNOSIS — M542 Cervicalgia: Secondary | ICD-10-CM | POA: Diagnosis not present

## 2016-04-01 DIAGNOSIS — M5031 Other cervical disc degeneration,  high cervical region: Secondary | ICD-10-CM | POA: Diagnosis not present

## 2016-04-17 DIAGNOSIS — M542 Cervicalgia: Secondary | ICD-10-CM | POA: Diagnosis not present

## 2016-05-08 DIAGNOSIS — M542 Cervicalgia: Secondary | ICD-10-CM | POA: Diagnosis not present

## 2016-05-08 DIAGNOSIS — M5031 Other cervical disc degeneration,  high cervical region: Secondary | ICD-10-CM | POA: Diagnosis not present

## 2016-05-20 DIAGNOSIS — Z1211 Encounter for screening for malignant neoplasm of colon: Secondary | ICD-10-CM | POA: Diagnosis not present

## 2016-05-20 DIAGNOSIS — Z23 Encounter for immunization: Secondary | ICD-10-CM | POA: Diagnosis not present

## 2016-05-20 DIAGNOSIS — Z0000 Encounter for general adult medical examination without abnormal findings: Secondary | ICD-10-CM | POA: Diagnosis not present

## 2016-05-21 ENCOUNTER — Telehealth: Payer: Self-pay | Admitting: Acute Care

## 2016-05-21 NOTE — Telephone Encounter (Signed)
Called and spoke with the patient, did not qualify for the Lung Screening Program d/t having smoking history less than 30 pack years. Patient is aware and voiced no further questions. Fax sent to Dr Juanetta GoslingHawkins making him aware -Per Medical records form faxed is not able to scanned into the patient's chart.

## 2016-05-27 DIAGNOSIS — I10 Essential (primary) hypertension: Secondary | ICD-10-CM | POA: Diagnosis not present

## 2016-05-27 DIAGNOSIS — Z0000 Encounter for general adult medical examination without abnormal findings: Secondary | ICD-10-CM | POA: Diagnosis not present

## 2016-05-27 DIAGNOSIS — Z125 Encounter for screening for malignant neoplasm of prostate: Secondary | ICD-10-CM | POA: Diagnosis not present

## 2016-05-27 DIAGNOSIS — N429 Disorder of prostate, unspecified: Secondary | ICD-10-CM | POA: Diagnosis not present

## 2016-05-27 DIAGNOSIS — E785 Hyperlipidemia, unspecified: Secondary | ICD-10-CM | POA: Diagnosis not present

## 2016-05-27 DIAGNOSIS — N529 Male erectile dysfunction, unspecified: Secondary | ICD-10-CM | POA: Diagnosis not present

## 2016-08-20 DIAGNOSIS — Z23 Encounter for immunization: Secondary | ICD-10-CM | POA: Diagnosis not present

## 2016-11-20 DIAGNOSIS — E785 Hyperlipidemia, unspecified: Secondary | ICD-10-CM | POA: Diagnosis not present

## 2016-11-20 DIAGNOSIS — M4712 Other spondylosis with myelopathy, cervical region: Secondary | ICD-10-CM | POA: Diagnosis not present

## 2016-11-20 DIAGNOSIS — N401 Enlarged prostate with lower urinary tract symptoms: Secondary | ICD-10-CM | POA: Diagnosis not present

## 2016-11-20 DIAGNOSIS — I10 Essential (primary) hypertension: Secondary | ICD-10-CM | POA: Diagnosis not present

## 2017-03-17 DIAGNOSIS — H52223 Regular astigmatism, bilateral: Secondary | ICD-10-CM | POA: Diagnosis not present

## 2017-03-17 DIAGNOSIS — H2513 Age-related nuclear cataract, bilateral: Secondary | ICD-10-CM | POA: Diagnosis not present

## 2017-03-17 DIAGNOSIS — H18413 Arcus senilis, bilateral: Secondary | ICD-10-CM | POA: Diagnosis not present

## 2017-03-17 DIAGNOSIS — H524 Presbyopia: Secondary | ICD-10-CM | POA: Diagnosis not present

## 2017-03-17 DIAGNOSIS — H5203 Hypermetropia, bilateral: Secondary | ICD-10-CM | POA: Diagnosis not present

## 2017-05-20 DIAGNOSIS — N429 Disorder of prostate, unspecified: Secondary | ICD-10-CM | POA: Diagnosis not present

## 2017-05-20 DIAGNOSIS — I10 Essential (primary) hypertension: Secondary | ICD-10-CM | POA: Diagnosis not present

## 2017-05-20 DIAGNOSIS — Z125 Encounter for screening for malignant neoplasm of prostate: Secondary | ICD-10-CM | POA: Diagnosis not present

## 2017-05-20 DIAGNOSIS — M4712 Other spondylosis with myelopathy, cervical region: Secondary | ICD-10-CM | POA: Diagnosis not present

## 2017-05-20 DIAGNOSIS — E785 Hyperlipidemia, unspecified: Secondary | ICD-10-CM | POA: Diagnosis not present

## 2017-05-20 DIAGNOSIS — Z0000 Encounter for general adult medical examination without abnormal findings: Secondary | ICD-10-CM | POA: Diagnosis not present

## 2017-05-20 DIAGNOSIS — N529 Male erectile dysfunction, unspecified: Secondary | ICD-10-CM | POA: Diagnosis not present

## 2017-11-18 ENCOUNTER — Ambulatory Visit (HOSPITAL_COMMUNITY)
Admission: RE | Admit: 2017-11-18 | Discharge: 2017-11-18 | Disposition: A | Discharge status: Home / Self Care | Payer: Medicare HMO | Source: Ambulatory Visit | Attending: Pulmonary Disease | Admitting: Pulmonary Disease

## 2017-11-18 ENCOUNTER — Other Ambulatory Visit (HOSPITAL_COMMUNITY): Payer: Self-pay | Admitting: Pulmonary Disease

## 2017-11-18 DIAGNOSIS — M25511 Pain in right shoulder: Secondary | ICD-10-CM

## 2017-11-18 DIAGNOSIS — N401 Enlarged prostate with lower urinary tract symptoms: Secondary | ICD-10-CM | POA: Diagnosis not present

## 2017-11-18 DIAGNOSIS — R2232 Localized swelling, mass and lump, left upper limb: Secondary | ICD-10-CM | POA: Insufficient documentation

## 2017-11-18 DIAGNOSIS — M79602 Pain in left arm: Secondary | ICD-10-CM | POA: Insufficient documentation

## 2017-11-18 DIAGNOSIS — M7989 Other specified soft tissue disorders: Secondary | ICD-10-CM

## 2017-11-18 DIAGNOSIS — M79621 Pain in right upper arm: Secondary | ICD-10-CM | POA: Diagnosis not present

## 2017-11-18 DIAGNOSIS — M4712 Other spondylosis with myelopathy, cervical region: Secondary | ICD-10-CM | POA: Diagnosis not present

## 2017-11-18 DIAGNOSIS — E785 Hyperlipidemia, unspecified: Secondary | ICD-10-CM | POA: Diagnosis not present

## 2017-11-18 DIAGNOSIS — R600 Localized edema: Secondary | ICD-10-CM | POA: Diagnosis not present

## 2018-05-11 DIAGNOSIS — H2513 Age-related nuclear cataract, bilateral: Secondary | ICD-10-CM | POA: Diagnosis not present

## 2018-05-18 DIAGNOSIS — Z0000 Encounter for general adult medical examination without abnormal findings: Secondary | ICD-10-CM | POA: Diagnosis not present

## 2018-05-18 DIAGNOSIS — Z23 Encounter for immunization: Secondary | ICD-10-CM | POA: Diagnosis not present

## 2018-05-20 DIAGNOSIS — I10 Essential (primary) hypertension: Secondary | ICD-10-CM | POA: Diagnosis not present

## 2018-05-20 DIAGNOSIS — M109 Gout, unspecified: Secondary | ICD-10-CM | POA: Diagnosis not present

## 2018-05-20 DIAGNOSIS — Z0000 Encounter for general adult medical examination without abnormal findings: Secondary | ICD-10-CM | POA: Diagnosis not present

## 2018-05-20 DIAGNOSIS — E669 Obesity, unspecified: Secondary | ICD-10-CM | POA: Diagnosis not present

## 2018-05-20 DIAGNOSIS — M545 Low back pain: Secondary | ICD-10-CM | POA: Diagnosis not present

## 2018-05-20 DIAGNOSIS — K219 Gastro-esophageal reflux disease without esophagitis: Secondary | ICD-10-CM | POA: Diagnosis not present

## 2018-05-20 DIAGNOSIS — E291 Testicular hypofunction: Secondary | ICD-10-CM | POA: Diagnosis not present

## 2018-05-20 DIAGNOSIS — R69 Illness, unspecified: Secondary | ICD-10-CM | POA: Diagnosis not present

## 2018-05-20 DIAGNOSIS — E1165 Type 2 diabetes mellitus with hyperglycemia: Secondary | ICD-10-CM | POA: Diagnosis not present

## 2018-05-20 DIAGNOSIS — Z125 Encounter for screening for malignant neoplasm of prostate: Secondary | ICD-10-CM | POA: Diagnosis not present

## 2018-06-29 DIAGNOSIS — Z23 Encounter for immunization: Secondary | ICD-10-CM | POA: Diagnosis not present

## 2019-01-25 DIAGNOSIS — I10 Essential (primary) hypertension: Secondary | ICD-10-CM | POA: Diagnosis not present

## 2019-01-25 DIAGNOSIS — R69 Illness, unspecified: Secondary | ICD-10-CM | POA: Diagnosis not present

## 2019-01-25 DIAGNOSIS — N401 Enlarged prostate with lower urinary tract symptoms: Secondary | ICD-10-CM | POA: Diagnosis not present

## 2019-04-27 DIAGNOSIS — Z0000 Encounter for general adult medical examination without abnormal findings: Secondary | ICD-10-CM | POA: Diagnosis not present

## 2019-05-02 DIAGNOSIS — M4712 Other spondylosis with myelopathy, cervical region: Secondary | ICD-10-CM | POA: Diagnosis not present

## 2019-05-02 DIAGNOSIS — N401 Enlarged prostate with lower urinary tract symptoms: Secondary | ICD-10-CM | POA: Diagnosis not present

## 2019-05-02 DIAGNOSIS — Z125 Encounter for screening for malignant neoplasm of prostate: Secondary | ICD-10-CM | POA: Diagnosis not present

## 2019-05-02 DIAGNOSIS — E785 Hyperlipidemia, unspecified: Secondary | ICD-10-CM | POA: Diagnosis not present

## 2019-05-02 DIAGNOSIS — N529 Male erectile dysfunction, unspecified: Secondary | ICD-10-CM | POA: Diagnosis not present

## 2019-05-02 DIAGNOSIS — N429 Disorder of prostate, unspecified: Secondary | ICD-10-CM | POA: Diagnosis not present

## 2019-05-02 DIAGNOSIS — Z0000 Encounter for general adult medical examination without abnormal findings: Secondary | ICD-10-CM | POA: Diagnosis not present

## 2019-05-02 DIAGNOSIS — R69 Illness, unspecified: Secondary | ICD-10-CM | POA: Diagnosis not present

## 2019-05-02 DIAGNOSIS — I10 Essential (primary) hypertension: Secondary | ICD-10-CM | POA: Diagnosis not present

## 2019-05-05 ENCOUNTER — Emergency Department (HOSPITAL_COMMUNITY): Payer: Medicare HMO

## 2019-05-05 ENCOUNTER — Other Ambulatory Visit: Payer: Self-pay

## 2019-05-05 ENCOUNTER — Inpatient Hospital Stay (HOSPITAL_COMMUNITY)
Admission: EM | Admit: 2019-05-05 | Discharge: 2019-05-07 | DRG: 065 | Disposition: A | Discharge status: Home / Self Care | Payer: Medicare HMO | Attending: Pulmonary Disease | Admitting: Pulmonary Disease

## 2019-05-05 DIAGNOSIS — G8194 Hemiplegia, unspecified affecting left nondominant side: Secondary | ICD-10-CM | POA: Diagnosis not present

## 2019-05-05 DIAGNOSIS — R270 Ataxia, unspecified: Secondary | ICD-10-CM | POA: Diagnosis not present

## 2019-05-05 DIAGNOSIS — Z7982 Long term (current) use of aspirin: Secondary | ICD-10-CM

## 2019-05-05 DIAGNOSIS — Z20828 Contact with and (suspected) exposure to other viral communicable diseases: Secondary | ICD-10-CM | POA: Diagnosis present

## 2019-05-05 DIAGNOSIS — N390 Urinary tract infection, site not specified: Secondary | ICD-10-CM | POA: Diagnosis present

## 2019-05-05 DIAGNOSIS — I10 Essential (primary) hypertension: Secondary | ICD-10-CM | POA: Diagnosis present

## 2019-05-05 DIAGNOSIS — F17210 Nicotine dependence, cigarettes, uncomplicated: Secondary | ICD-10-CM | POA: Diagnosis present

## 2019-05-05 DIAGNOSIS — I639 Cerebral infarction, unspecified: Principal | ICD-10-CM | POA: Diagnosis present

## 2019-05-05 DIAGNOSIS — E876 Hypokalemia: Secondary | ICD-10-CM | POA: Diagnosis present

## 2019-05-05 DIAGNOSIS — E785 Hyperlipidemia, unspecified: Secondary | ICD-10-CM | POA: Diagnosis present

## 2019-05-05 DIAGNOSIS — R29704 NIHSS score 4: Secondary | ICD-10-CM | POA: Diagnosis present

## 2019-05-05 DIAGNOSIS — Z8249 Family history of ischemic heart disease and other diseases of the circulatory system: Secondary | ICD-10-CM

## 2019-05-05 DIAGNOSIS — E871 Hypo-osmolality and hyponatremia: Secondary | ICD-10-CM | POA: Diagnosis present

## 2019-05-05 LAB — URINALYSIS, ROUTINE W REFLEX MICROSCOPIC
Bilirubin Urine: NEGATIVE
Glucose, UA: NEGATIVE mg/dL
Ketones, ur: NEGATIVE mg/dL
Nitrite: POSITIVE — AB
Protein, ur: NEGATIVE mg/dL
Specific Gravity, Urine: 1.006 (ref 1.005–1.030)
pH: 7 (ref 5.0–8.0)

## 2019-05-05 LAB — CBC
HCT: 47.5 % (ref 39.0–52.0)
Hemoglobin: 16.5 g/dL (ref 13.0–17.0)
MCH: 29.9 pg (ref 26.0–34.0)
MCHC: 34.7 g/dL (ref 30.0–36.0)
MCV: 86.1 fL (ref 80.0–100.0)
Platelets: 280 10*3/uL (ref 150–400)
RBC: 5.52 MIL/uL (ref 4.22–5.81)
RDW: 13 % (ref 11.5–15.5)
WBC: 12 10*3/uL — ABNORMAL HIGH (ref 4.0–10.5)
nRBC: 0 % (ref 0.0–0.2)

## 2019-05-05 LAB — DIFFERENTIAL
Abs Immature Granulocytes: 0.04 10*3/uL (ref 0.00–0.07)
Basophils Absolute: 0.1 10*3/uL (ref 0.0–0.1)
Basophils Relative: 1 %
Eosinophils Absolute: 0.3 10*3/uL (ref 0.0–0.5)
Eosinophils Relative: 2 %
Immature Granulocytes: 0 %
Lymphocytes Relative: 34 %
Lymphs Abs: 4.1 10*3/uL — ABNORMAL HIGH (ref 0.7–4.0)
Monocytes Absolute: 1.1 10*3/uL — ABNORMAL HIGH (ref 0.1–1.0)
Monocytes Relative: 9 %
Neutro Abs: 6.5 10*3/uL (ref 1.7–7.7)
Neutrophils Relative %: 54 %

## 2019-05-05 LAB — RAPID URINE DRUG SCREEN, HOSP PERFORMED
Amphetamines: NOT DETECTED
Barbiturates: NOT DETECTED
Benzodiazepines: NOT DETECTED
Cocaine: NOT DETECTED
Opiates: NOT DETECTED
Tetrahydrocannabinol: NOT DETECTED

## 2019-05-05 LAB — COMPREHENSIVE METABOLIC PANEL
ALT: 27 U/L (ref 0–44)
AST: 23 U/L (ref 15–41)
Albumin: 4 g/dL (ref 3.5–5.0)
Alkaline Phosphatase: 68 U/L (ref 38–126)
Anion gap: 10 (ref 5–15)
BUN: 11 mg/dL (ref 8–23)
CO2: 27 mmol/L (ref 22–32)
Calcium: 8.8 mg/dL — ABNORMAL LOW (ref 8.9–10.3)
Chloride: 94 mmol/L — ABNORMAL LOW (ref 98–111)
Creatinine, Ser: 0.84 mg/dL (ref 0.61–1.24)
GFR calc Af Amer: >60 mL/min (ref >60)
GFR calc non Af Amer: >60 mL/min (ref >60)
Glucose, Bld: 99 mg/dL (ref 70–99)
Potassium: 2.8 mmol/L — ABNORMAL LOW (ref 3.5–5.1)
Sodium: 131 mmol/L — ABNORMAL LOW (ref 135–145)
Total Bilirubin: 0.7 mg/dL (ref 0.3–1.2)
Total Protein: 7.4 g/dL (ref 6.5–8.1)

## 2019-05-05 LAB — ETHANOL: Alcohol, Ethyl (B): <10 mg/dL (ref <10)

## 2019-05-05 LAB — PROTIME-INR
INR: 1.1 (ref 0.8–1.2)
Prothrombin Time: 13.9 seconds (ref 11.4–15.2)

## 2019-05-05 LAB — TROPONIN I (HIGH SENSITIVITY): Troponin I (High Sensitivity): 3 ng/L (ref <18)

## 2019-05-05 LAB — APTT: aPTT: 31 seconds (ref 24–36)

## 2019-05-05 NOTE — ED Provider Notes (Signed)
[facility] Provider Note   CSN: [id number] Arrival date & time: 05/05/19  2108    History   Chief Complaint Chief Complaint  Patient presents with   Numbness    left arm   difficulty  walking    HPI Steven Hahn is a 68 y.o. male.     Patient presents to the emergency department for evaluation of numbness and weakness of his left arm and leg.  Symptoms began at 6 PM yesterday (more than 24 hours ago).  He reports that he was cooking dinner when he suddenly noticed that his left arm and hand felt numb.  He then noted that he was having trouble walking because his left leg was weak and clumsy, felt like it kept bending at the knee and he could not control it.  Since then symptoms have been persistent, he has having trouble picking things up and holding onto things with his left arm.  No headache, blurred vision, double vision.  He has not had any facial drooping or difficulty with speech.     Past Medical History:  Diagnosis Date   Hypertension     Patient Active Problem List   Diagnosis Date Noted   Special screening for malignant neoplasms, colon     Past Surgical History:  Procedure Laterality Date   BACK SURGERY     COLONOSCOPY N/A 04/30/2015   Procedure: COLONOSCOPY;  Surgeon: Corbin Adeobert M Rourk, MD;  Location: [facility];  Service: Endoscopy;  Laterality: N/A;  10:30 Am   CYSTOSCOPY  05/02/2012   Procedure: CYSTOSCOPY;  Surgeon: Sebastian Acheheodore Manny, MD;  Location: [facility];  Service: Urology;  Laterality: N/A;  removal of foreign body        Home Medications    Prior to Admission medications   Medication Sig Start Date End Date Taking? Authorizing Provider  aspirin 81 MG tablet Take 81 mg by mouth daily.   Yes [provider]  lisinopril-hydrochlorothiazide (PRINZIDE,ZESTORETIC) 20-25 MG per tablet Take 1 tablet by mouth daily. 02/10/15  Yes [provider]  metoprolol succinate (TOPROL-XL) 50 MG 24 hr tablet Take 50 mg  by mouth daily. Take with or immediately following a meal.   Yes [provider]  pravastatin (PRAVACHOL) 10 MG tablet Take 10 mg by mouth at bedtime.    Yes [provider]  Tamsulosin HCl (FLOMAX) 0.4 MG CAPS Take 0.4 mg by mouth daily after supper.    Yes [provider]  amoxicillin (AMOXIL) 500 MG capsule Take 500 mg by mouth 3 (three) times daily. 05/04/19   [provider]    Family History No family history on file.  Social History Social History   Tobacco Use   Smoking status: Current Every Day Smoker  Substance Use Topics   Alcohol use: No   Drug use: No     Allergies   Patient has no known allergies.   Review of Systems Review of Systems  Neurological: Positive for weakness (Left hemiparesis) and numbness (Left arm and leg).  All other systems reviewed and are negative.    Physical Exam Updated Vital Signs BP 127/83 (BP Location: Right Arm)    Pulse 60    Temp 98.3 F (36.8 C) (Oral)    Resp 15    Ht 5\' 8"  (1.727 m)    Wt 70.3 kg    SpO2 100%    BMI 23.57 kg/m   Physical Exam Vitals signs and nursing note reviewed.  Constitutional:  General: He is not in acute distress.    Appearance: Normal appearance. He is well-developed.  HENT:     Head: Normocephalic and atraumatic.     Right Ear: Hearing normal.     Left Ear: Hearing normal.     Nose: Nose normal.  Eyes:     Conjunctiva/sclera: Conjunctivae normal.     Pupils: Pupils are equal, round, and reactive to light.  Neck:     Musculoskeletal: Normal range of motion and neck supple.  Cardiovascular:     Rate and Rhythm: Regular rhythm.     Heart sounds: S1 normal and S2 normal. No murmur. No friction rub. No gallop.   Pulmonary:     Effort: Pulmonary effort is normal. No respiratory distress.     Breath sounds: Normal breath sounds.  Chest:     Chest wall: No tenderness.  Abdominal:     General: Bowel sounds are normal.     Palpations: Abdomen is soft.      Tenderness: There is no abdominal tenderness. There is no guarding or rebound. Negative signs include Murphy's sign and McBurney's sign.     Hernia: No hernia is present.  Musculoskeletal: Normal range of motion.  Skin:    General: Skin is warm and dry.     Findings: No rash.  Neurological:     Mental Status: He is alert and oriented to person, place, and time.     GCS: GCS eye subscore is 4. GCS verbal subscore is 5. GCS motor subscore is 6.     Cranial Nerves: No cranial nerve deficit.     Sensory: No sensory deficit.     Coordination: Coordination normal.     Comments: Extraocular muscle movement: normal No visual field cut Pupils: equal and reactive both direct and consensual response is normal    Sensory function is decreased to light touch, pinprick on left side Proprioception abnormal on left, patient with ataxia and endpoint dysmetria  Strength 4 out of 5 left upper and left lower extremity, 5 out of 5 on right  +pronator drift on left abnormal finger to nose on left  Abnormal heel to shin on left     Psychiatric:        Speech: Speech normal.        Behavior: Behavior normal.        Thought Content: Thought content normal.      ED Treatments / Results  Labs (all labs ordered are listed, but only abnormal results are displayed) Labs Reviewed  CBC - Abnormal; Notable for the following components:      Result Value   WBC 12.0 (*)    All other components within normal limits  DIFFERENTIAL - Abnormal; Notable for the following components:   Lymphs Abs 4.1 (*)    Monocytes Absolute 1.1 (*)    All other components within normal limits  COMPREHENSIVE METABOLIC PANEL - Abnormal; Notable for the following components:   Sodium 131 (*)    Potassium 2.8 (*)    Chloride 94 (*)    Calcium 8.8 (*)    All other components within normal limits  URINALYSIS, ROUTINE W REFLEX MICROSCOPIC - Abnormal; Notable for the following components:   APPearance HAZY (*)    Hgb urine  dipstick SMALL (*)    Nitrite POSITIVE (*)    Leukocytes,Ua MODERATE (*)    Bacteria, UA RARE (*)    All other components within normal limits  ETHANOL  PROTIME-INR  APTT  RAPID  URINE DRUG SCREEN, HOSP PERFORMED  TROPONIN I (HIGH SENSITIVITY)  TROPONIN I (HIGH SENSITIVITY)    EKG EKG Interpretation  Date/Time:  Thursday May 05 2019 21:43:59 EDT Ventricular Rate:  78 PR Interval:    QRS Duration: 109 QT Interval:  396 QTC Calculation: 452 R Axis:   78 Text Interpretation:  Sinus rhythm Probable left atrial enlargement No significant change since last tracing Confirmed by Gilda CreasePollina, Denna Fryberger J [phone] on 05/05/2019 10:58:38 PM   Radiology Ct Head Wo Contrast  Result Date: 05/05/2019 CLINICAL DATA:  Ataxia beginning at 6 p.m. yesterday EXAM: CT HEAD WITHOUT CONTRAST TECHNIQUE: Contiguous axial images were obtained from the base of the skull through the vertex without intravenous contrast. COMPARISON:  None. FINDINGS: Brain: No evidence of acute infarction, hemorrhage, hydrocephalus, extra-axial collection or mass lesion/mass effect. Symmetric prominence of the ventricles, cisterns and sulci compatible with parenchymal volume loss. Patchy areas of white matter hypoattenuation are most compatible with chronic microvascular angiopathy. Vascular: Atherosclerotic calcification of the carotid siphons and intradural vertebral arteries. Skull: No calvarial fracture or suspicious osseous lesion. No scalp swelling or hematoma. Sinuses/Orbits: Orbital structures are unremarkable. Paranasal sinuses and mastoid air cells are predominantly clear. Other: None. IMPRESSION: No acute intracranial abnormality. If persisting clinical concern for acute/subacute infarct, MRI could be obtained. Background of diffuse parenchymal volume loss and chronic white matter disease. Electronically Signed   By: Kreg ShropshirePrice  DeHay M.D.   On: 05/05/2019 22:38    Procedures Procedures (including critical care  time)  Medications Ordered in ED Medications - No data to display   Initial Impression / Assessment and Plan / ED Course  I have reviewed the triage vital signs and the nursing notes.  Pertinent labs & imaging results that were available during my care of the patient were reviewed by me and considered in my medical decision making (see chart for details).        Patient presented to the emergency department approximately 29 hours after onset of stroke symptoms.  Patient had sudden onset of left arm and leg numbness and weakness yesterday.  Symptoms have been persistent and unchanged since onset.  Examination very concerning for stroke.  CT scan did not show any obvious abnormality, no bleed or mass-effect.  Appreciate tele-neurology consultation.  Recommendation for admission for stroke work-up including MRI brain, MRA head and neck.  Will consult hospitalist for admission.  Final Clinical Impressions(s) / ED Diagnoses   Final diagnoses:  Hemiparesis of left nondominant side, unspecified hemiparesis etiology [facility](HCC)    ED Discharge Orders    None       Merin Borjon, Canary Brimhristopher J, MD 05/06/19 0004

## 2019-05-05 NOTE — ED Notes (Signed)
Pt states he does not need to urinate at this time, aware of DO, urinal placed at bedside, call light w/in reach

## 2019-05-05 NOTE — ED Notes (Addendum)
Neuro consult in progress.

## 2019-05-05 NOTE — Consult Note (Signed)
[facility] Tele[facility]urology Consult Services  Stat Consult  Date of Service:   05/05/2019 23:24:11  Impression:     .  Rule Out Acute Ischemic Stroke  Comments/Sign-Out: Patient is a 68 yo male with a PMH of HTN, HLD, nicotine usage who p/w left sided weakness, numbness and ataxia. LNK 05/04/19 18:00, 29 hours prior. On exam LUE and LLE ataxia, and LLE weakness. Given LNK of >24 hours he is not a candidate for TPA and NIR.  CT HEAD: Showed No Acute Hemorrhage or Acute Core Infarct  Metrics: [facility] Notification Time: 05/05/2019 23:24:11 Stamp Time: 05/05/2019 23:24:11 Callback Response Time: 05/05/2019 23:28:17 Video Start Time: 05/05/2019 23:47:49 Video End Time: 05/06/2019 00:00:04  Our recommendations are outlined below.  Recommendations:     .  Initiate Aspirin 81 MG Daily     .  ASA 325mg  in ED  Imaging Studies:     .  MRI Head Without Contrast     .  MRA Head Without Contrast     .  MRA Neck with and Without Contrast     .  Echocardiogram - Transthoracic Echocardiogram  Therapies:     .  Physical Therapy, Occupational Therapy, Speech Therapy Assessment When Applicable  Other WorkUp:     .  Infectious/metabolic workup per primary team  Disposition: Neurology Follow Up Recommended  Sign Out:     .  Discussed with Emergency Department Provider  ----------------------------------------------------------------------------------------------------  Chief Complaint: Stroke  History of Present Illness: Patient is a 68 year old Male.  Patient is a 68 yo male with a PMH of HTN, HLD, nicotine usage who p/w left sided weakness, numbness and ataxia. LNK 05/04/19 18:00, 29 hours prior. He was standing in the kitchen yesterday evening at 18:00 when he developed left arm numbness. He noticed clumsiness and weakness of the left arm. In addition, his left leg was giving way and felt weak while walking. No prior h/o stroke. He is on ASA daily,  81mg .  Examination: BP(174/94), 1A: Level of Consciousness - Alert; keenly responsive + 0 1B: Ask Month and Age - Both Questions Right + 0 1C: Blink Eyes & Squeeze Hands - Performs Both Tasks + 0 2: Test Horizontal Extraocular Movements - Normal + 0 3: Test Visual Fields - No Visual Loss + 0 4: Test Facial Palsy (Use Grimace if Obtunded) - Normal symmetry + 0 5A: Test Left Arm Motor Drift - No Drift for 10 Seconds + 0 5B: Test Right Arm Motor Drift - No Drift for 10 Seconds + 0 6A: Test Left Leg Motor Drift - Drift, hits bed + 2 6B: Test Right Leg Motor Drift - No Drift for 5 Seconds + 0 7: Test Limb Ataxia (FNF/Heel-Shin) - Ataxia in 2 Limbs + 2 8: Test Sensation - Normal; No sensory loss + 0 9: Test Language/Aphasia - Normal; No aphasia + 0 10: Test Dysarthria - Normal + 0 11: Test Extinction/Inattention - No abnormality + 0  NIHSS Score: 4  Patient/Family was informed the Neurology Consult would happen via TeleHealth consult by way of interactive audio and video telecommunications and consented to receiving care in this manner.  Due to the immediate potential for life-threatening deterioration due to underlying acute neurologic illness, I spent 35 minutes providing critical care. This time includes time for face to face visit via telemedicine, review of medical records, imaging studies and discussion of findings with providers, the patient and/or family.   Dr Jennefer BravoNeetu Zackari Ruane   [facility] [phone]  Case [id number]

## 2019-05-05 NOTE — ED Triage Notes (Signed)
Pt reports decreased sensation, numbness to left arm, pt unable to walk as normal, "feels wobblely". Unsteady gait noted in triage when asked to take a few steps. These symptoms started yesterday at 6pm. Pt reports symptoms continued today. No facial asymmetry or difficulty with speech. Equal grips.

## 2019-05-05 NOTE — ED Notes (Signed)
Tele neuro cart in room. Pt and family instructed to call when neurologist is on screen

## 2019-05-06 ENCOUNTER — Inpatient Hospital Stay (HOSPITAL_COMMUNITY): Payer: Medicare HMO

## 2019-05-06 ENCOUNTER — Encounter (HOSPITAL_COMMUNITY): Payer: Self-pay | Admitting: Internal Medicine

## 2019-05-06 DIAGNOSIS — E785 Hyperlipidemia, unspecified: Secondary | ICD-10-CM | POA: Diagnosis not present

## 2019-05-06 DIAGNOSIS — G8194 Hemiplegia, unspecified affecting left nondominant side: Secondary | ICD-10-CM | POA: Diagnosis not present

## 2019-05-06 DIAGNOSIS — R270 Ataxia, unspecified: Secondary | ICD-10-CM | POA: Diagnosis not present

## 2019-05-06 DIAGNOSIS — E876 Hypokalemia: Secondary | ICD-10-CM | POA: Diagnosis not present

## 2019-05-06 DIAGNOSIS — I340 Nonrheumatic mitral (valve) insufficiency: Secondary | ICD-10-CM

## 2019-05-06 DIAGNOSIS — Z20828 Contact with and (suspected) exposure to other viral communicable diseases: Secondary | ICD-10-CM | POA: Diagnosis not present

## 2019-05-06 DIAGNOSIS — E871 Hypo-osmolality and hyponatremia: Secondary | ICD-10-CM | POA: Diagnosis not present

## 2019-05-06 DIAGNOSIS — I10 Essential (primary) hypertension: Secondary | ICD-10-CM

## 2019-05-06 DIAGNOSIS — F17210 Nicotine dependence, cigarettes, uncomplicated: Secondary | ICD-10-CM | POA: Diagnosis present

## 2019-05-06 DIAGNOSIS — N390 Urinary tract infection, site not specified: Secondary | ICD-10-CM | POA: Diagnosis not present

## 2019-05-06 DIAGNOSIS — I639 Cerebral infarction, unspecified: Principal | ICD-10-CM

## 2019-05-06 DIAGNOSIS — R29704 NIHSS score 4: Secondary | ICD-10-CM | POA: Diagnosis not present

## 2019-05-06 DIAGNOSIS — R69 Illness, unspecified: Secondary | ICD-10-CM | POA: Diagnosis not present

## 2019-05-06 DIAGNOSIS — R471 Dysarthria and anarthria: Secondary | ICD-10-CM | POA: Diagnosis not present

## 2019-05-06 DIAGNOSIS — Z8249 Family history of ischemic heart disease and other diseases of the circulatory system: Secondary | ICD-10-CM | POA: Diagnosis not present

## 2019-05-06 DIAGNOSIS — I69952 Hemiplegia and hemiparesis following unspecified cerebrovascular disease affecting left dominant side: Secondary | ICD-10-CM | POA: Diagnosis not present

## 2019-05-06 DIAGNOSIS — Z7982 Long term (current) use of aspirin: Secondary | ICD-10-CM | POA: Diagnosis not present

## 2019-05-06 LAB — COMPREHENSIVE METABOLIC PANEL
ALT: 24 U/L (ref 0–44)
AST: 21 U/L (ref 15–41)
Albumin: 3.6 g/dL (ref 3.5–5.0)
Alkaline Phosphatase: 59 U/L (ref 38–126)
Anion gap: 10 (ref 5–15)
BUN: 9 mg/dL (ref 8–23)
CO2: 27 mmol/L (ref 22–32)
Calcium: 8.5 mg/dL — ABNORMAL LOW (ref 8.9–10.3)
Chloride: 96 mmol/L — ABNORMAL LOW (ref 98–111)
Creatinine, Ser: 0.69 mg/dL (ref 0.61–1.24)
GFR calc Af Amer: >60 mL/min (ref >60)
GFR calc non Af Amer: >60 mL/min (ref >60)
Glucose, Bld: 94 mg/dL (ref 70–99)
Potassium: 3.2 mmol/L — ABNORMAL LOW (ref 3.5–5.1)
Sodium: 133 mmol/L — ABNORMAL LOW (ref 135–145)
Total Bilirubin: 0.8 mg/dL (ref 0.3–1.2)
Total Protein: 6.8 g/dL (ref 6.5–8.1)

## 2019-05-06 LAB — CBC
HCT: 44.5 % (ref 39.0–52.0)
Hemoglobin: 15.1 g/dL (ref 13.0–17.0)
MCH: 29.5 pg (ref 26.0–34.0)
MCHC: 33.9 g/dL (ref 30.0–36.0)
MCV: 87.1 fL (ref 80.0–100.0)
Platelets: 284 10*3/uL (ref 150–400)
RBC: 5.11 MIL/uL (ref 4.22–5.81)
RDW: 13.1 % (ref 11.5–15.5)
WBC: 12.2 10*3/uL — ABNORMAL HIGH (ref 4.0–10.5)
nRBC: 0 % (ref 0.0–0.2)

## 2019-05-06 LAB — HEMOGLOBIN A1C
Hgb A1c MFr Bld: 5.4 % (ref 4.8–5.6)
Mean Plasma Glucose: 108.28 mg/dL

## 2019-05-06 LAB — LIPID PANEL
Cholesterol: 154 mg/dL (ref 0–200)
HDL: 31 mg/dL — ABNORMAL LOW (ref >40)
LDL Cholesterol: 106 mg/dL — ABNORMAL HIGH (ref 0–99)
Total CHOL/HDL Ratio: 5 RATIO
Triglycerides: 84 mg/dL (ref <150)
VLDL: 17 mg/dL (ref 0–40)

## 2019-05-06 LAB — SARS CORONAVIRUS 2 (TAT 6-24 HRS): SARS Coronavirus 2: NEGATIVE

## 2019-05-06 LAB — TROPONIN I (HIGH SENSITIVITY): Troponin I (High Sensitivity): 3 ng/L (ref <18)

## 2019-05-06 LAB — ECHOCARDIOGRAM COMPLETE
Height: 68 in
Weight: 2518.54 oz

## 2019-05-06 LAB — GLUCOSE, CAPILLARY: Glucose-Capillary: 104 mg/dL — ABNORMAL HIGH (ref 70–99)

## 2019-05-06 MED ORDER — ACETAMINOPHEN 650 MG RE SUPP: 650.0000 mg | RECTAL | Status: DC | PRN

## 2019-05-06 MED ORDER — POTASSIUM CHLORIDE CRYS ER 20 MEQ PO TBCR
40.0000 meq | EXTENDED_RELEASE_TABLET | Freq: Once | ORAL | Status: AC
  Administered 2019-05-06: 02:00:00 40 meq via ORAL
  Filled 2019-05-06: qty 2

## 2019-05-06 MED ORDER — ACETAMINOPHEN 160 MG/5ML PO SOLN: 650.0000 mg | ORAL | Status: DC | PRN

## 2019-05-06 MED ORDER — TAMSULOSIN HCL 0.4 MG PO CAPS
0.4000 mg | ORAL_CAPSULE | Freq: Every day | ORAL | Status: DC
  Administered 2019-05-06: 18:00:00 0.4 mg via ORAL
  Filled 2019-05-06: qty 1

## 2019-05-06 MED ORDER — ASPIRIN 300 MG RE SUPP: 300.0000 mg | Freq: Every day | RECTAL | Status: DC

## 2019-05-06 MED ORDER — ATORVASTATIN CALCIUM 40 MG PO TABS
80.0000 mg | ORAL_TABLET | Freq: Every day | ORAL | Status: DC
  Administered 2019-05-06: 80 mg via ORAL
  Filled 2019-05-06: qty 2

## 2019-05-06 MED ORDER — GADOBUTROL 1 MMOL/ML IV SOLN
7.0000 mL | Freq: Once | INTRAVENOUS | Status: AC | PRN
  Administered 2019-05-06: 10:00:00 7 mL via INTRAVENOUS

## 2019-05-06 MED ORDER — POTASSIUM CHLORIDE IN NACL 20-0.9 MEQ/L-% IV SOLN
INTRAVENOUS | Status: AC
  Administered 2019-05-06: 03:00:00 via INTRAVENOUS

## 2019-05-06 MED ORDER — POTASSIUM CHLORIDE CRYS ER 20 MEQ PO TBCR
40.0000 meq | EXTENDED_RELEASE_TABLET | Freq: Once | ORAL | Status: AC
  Administered 2019-05-06: 10:00:00 40 meq via ORAL
  Filled 2019-05-06: qty 2

## 2019-05-06 MED ORDER — ASPIRIN 325 MG PO TABS
325.0000 mg | ORAL_TABLET | Freq: Every day | ORAL | Status: DC
  Administered 2019-05-06 – 2019-05-07 (×2): 325 mg via ORAL
  Filled 2019-05-06 (×2): qty 1

## 2019-05-06 MED ORDER — ACETAMINOPHEN 325 MG PO TABS: 650.0000 mg | ORAL_TABLET | ORAL | Status: DC | PRN

## 2019-05-06 MED ORDER — METOPROLOL SUCCINATE ER 50 MG PO TB24
50.0000 mg | ORAL_TABLET | Freq: Every day | ORAL | Status: DC
  Administered 2019-05-06 – 2019-05-07 (×2): 50 mg via ORAL
  Filled 2019-05-06 (×2): qty 1

## 2019-05-06 MED ORDER — PRAVASTATIN SODIUM 10 MG PO TABS
10.0000 mg | ORAL_TABLET | Freq: Every day | ORAL | Status: DC
  Filled 2019-05-06: qty 1

## 2019-05-06 MED ORDER — SODIUM CHLORIDE 0.9 % IV SOLN
1.0000 g | INTRAVENOUS | Status: DC
  Administered 2019-05-06 – 2019-05-07 (×2): 1 g via INTRAVENOUS
  Filled 2019-05-06 (×2): qty 10

## 2019-05-06 MED ORDER — STROKE: EARLY STAGES OF RECOVERY BOOK
Freq: Once | Status: AC
  Administered 2019-05-06: 02:00:00
  Filled 2019-05-06: qty 1

## 2019-05-06 NOTE — Progress Notes (Signed)
*  PRELIMINARY RESULTS* Echocardiogram 2D Echocardiogram has been performed.  Samuel Germany 05/06/2019, 4:26 PM

## 2019-05-06 NOTE — Evaluation (Addendum)
Occupational Therapy Evaluation Patient Details Name: Steven Hahn MRN: [id number] DOB: 30-Jun-1951 Today's Date: 05/06/2019    History of Present Illness Steven Hahn  is a 68 y.o. male, w hypertension, hyperlipidemia, tobacco abuse apparently presents with c/o left sided weakness, numbness, ataxia w LNK 05/04/2019 at 18:00. MRI pending.    Clinical Impression   Pt agreeable to OT evaluation this am, reports continued "wobbly" feeling. Pt with left side weakness and ataxia, increased time for task completion. During standing ADLs pt requiring min guard as he is unsteady while completing tasks without UE support. Pt requiring increased time due to gross and fine motor coordination deficits. Recommend outpatient OT services on discharge to improve LUE strength and coordination required for ADL completion.      Follow Up Recommendations    Outpatient OT   Equipment Recommendations    Tub/shower seat      Precautions / Restrictions Precautions Precautions: Fall Restrictions Weight Bearing Restrictions: No      Mobility Bed Mobility Overal bed mobility: Modified Independent                Transfers Overall transfer level: Needs assistance Equipment used: Rolling walker (2 wheeled) Transfers: Sit to/from Stand Sit to Stand: Min guard                  ADL either performed or assessed with clinical judgement   ADL Overall ADL's : Needs assistance/impaired     Grooming: Wash/dry hands;Min guard;Standing Grooming Details (indicate cue type and reason): Pt unsteady while performing tasks without UE support                 Toilet Transfer: Min guard;Ambulation;Regular Toilet;RW Toilet Transfer Details (indicate cue type and reason): simulated during session         Functional mobility during ADLs: Min guard;Rolling walker General ADL Comments: Increased time for functional mobility and ADLs due to ataxia     Vision Baseline Vision/History: Wears  glasses Wears Glasses: At all times Patient Visual Report: No change from baseline Vision Assessment?: Yes Eye Alignment: Within Functional Limits Ocular Range of Motion: Within Functional Limits Alignment/Gaze Preference: Within Defined Limits Tracking/Visual Pursuits: Able to track stimulus in all quads without difficulty Saccades: Within functional limits Convergence: Within functional limits Visual Fields: No apparent deficits            Pertinent Vitals/Pain Pain Assessment: No/denies pain     Hand Dominance Right   Extremity/Trunk Assessment Upper Extremity Assessment Upper Extremity Assessment: LUE deficits/detail LUE Deficits / Details: Strength: shoulder 4/5, elbow flexion 4/5, elbow extension 3/5, wrist 5/5. Grip strength WNL LUE Sensation: WNL LUE Coordination: decreased fine motor;decreased gross motor   Lower Extremity Assessment Lower Extremity Assessment: Defer to PT evaluation   Cervical / Trunk Assessment Cervical / Trunk Assessment: Normal   Communication Communication Communication: No difficulties   Cognition Arousal/Alertness: Awake/alert Behavior During Therapy: WFL for tasks assessed/performed Overall Cognitive Status: Within Functional Limits for tasks assessed                                                Home Living Family/patient expects to be discharged to:: Private residence Living Arrangements: Spouse/significant other Available Help at Discharge: Family;Available 24 hours/day Type of Home: Mobile home Home Access: Stairs to enter Entrance Stairs-Number of Steps: 4 Entrance Stairs-Rails: Right;Left;Can reach both Home  Layout: One level     Bathroom Shower/Tub: Tub/shower unit;Walk-in shower   Bathroom Toilet: Standard     Home Equipment: Walker - 2 wheels          Prior Functioning/Environment Level of Independence: Independent                 OT Problem List: Decreased strength;Decreased  activity tolerance;Impaired balance (sitting and/or standing);Decreased coordination;Impaired UE functional use      OT Treatment/Interventions: Self-care/ADL training;Therapeutic exercise;Neuromuscular education;Therapeutic activities;Patient/family education    OT Goals(Current goals can be found in the care plan section) Acute Rehab OT Goals Patient Stated Goal: to improve my strength OT Goal Formulation: With patient Time For Goal Achievement: 05/20/19 Potential to Achieve Goals: Good  OT Frequency: Min 2X/week    End of Session Equipment Utilized During Treatment: Gait belt;Rolling walker  Activity Tolerance: Patient tolerated treatment well Patient left: in bed;with call bell/phone within reach  OT Visit Diagnosis: Muscle weakness (generalized) (M62.81);Unsteadiness on feet (R26.81)                Time: [id number] OT Time Calculation (min): 19 min Charges:  OT General Charges $OT Visit: 1 Visit OT Evaluation $OT Eval Low Complexity: 1 Low   Ezra SitesLeslie Canuto [place], OTR/L  [phone] 05/06/2019, 8:20 AM

## 2019-05-06 NOTE — Progress Notes (Signed)
SLP Cancellation Note  Patient Details Name: Steven Hahn MRN: [id number] DOB: 28-Dec-1950   Cancelled treatment:       Reason Eval/Treat Not Completed: SLP screened, no needs identified, will sign off. Pt's cognitive linguistic abilities are at baseline. Thank you for this referral,  Amelia H. Roddie Mc, CCC-SLP Speech Language Pathologist    Wende Bushy 05/06/2019, 10:55 AM

## 2019-05-06 NOTE — Consult Note (Addendum)
[facility] Steven Hahn A. Steven Pilgrimoonquah, MD     [email or web address]          Steven Hahn is an 68 y.o. male.   ASSESSMENT/PLAN: 1.  Bilateral small infarcts which raises the possibility and likelihood of cardioembolic strokes.  Risk factors hypertension, age and the nicotine use.  Dual antiplatelet agents are recommended for the next month.  Subsequently, Plavix is recommended.  Additionally, a 30-day event monitor is recommended.  Smoking cessation is discussed with the patient.  Agree with the use of statin.    Patient is 68 year old right-handed white male who presents with the acute onset of weakness, numbness and incoordination of the left upper extremity and left lower extremity.  The patient denies any palpitation or chest pain.  There is no shortness of breath.  He does not report headaches or dysarthria although he seems to be a little dysarthric but he reports his speech has been normal.  There is no alteration of consciousness or loss of consciousness.  He has been on 81 mg aspirin and has been compliant with this.  He does have a longstanding history of nicotine use but reports he has weaned himself down to half a pack.  The review of systems otherwise negative.    GENERAL: The patient is doing also well at this time although he is coughing a little after eating his dinner.  He however is in no acute distress.  HEENT: Neck is supple no trauma appreciated.  ABDOMEN: soft  EXTREMITIES: No edema   BACK: Normal  SKIN: Normal by inspection.    MENTAL STATUS: Alert and oriented including orientation to his age and the month. Speech -subtle dysarthria, language and cognition are generally intact. Judgment and insight normal.   CRANIAL NERVES: Pupils are equal, round and reactive to light and accomodation; extra ocular movements are full, there is no significant nystagmus; visual fields are full; upper and lower facial muscles are normal in strength and symmetric, there is  no flattening of the nasolabial folds; tongue is midline; uvula is midline; shoulder elevation is normal.  MOTOR: There is mild drift pronator type of the left upper extremity.  There is also mild drift of the left leg.  Left deltoid is 4+, triceps 5 and handgrip 4+.  Left hip flexion is 4+ and dorsiflexion 5.  The right side shows normal tone, bulk and strength.  There is no drift on the right side.  COORDINATION: There is moderate dysmetria involving the left upper extremity and left leg.  There is no dysmetria of the right upper extremity or right leg. No rest tremor; no intention tremor; no postural tremor; no bradykinesia.  REFLEXES: Deep tendon reflexes are symmetrical and normal.    SENSATION: Normal to light touch, temperature, and pain.  GAIT: Normal.   NIH stroke scale 1, 1, 1, 2 total 5.   Blood pressure 135/79, pulse 61, temperature 98.2 F (36.8 C), temperature source Oral, resp. rate 18, height 5\' 8"  (1.727 m), weight 71.4 kg, SpO2 95 %.  Past Medical History:  Diagnosis Date   Hypertension     Past Surgical History:  Procedure Laterality Date   BACK SURGERY     COLONOSCOPY N/A 04/30/2015   Procedure: COLONOSCOPY;  Surgeon: Corbin Adeobert M Rourk, MD;  [facility];  Service: Endoscopy;  Laterality: N/A;  10:30 Am   CYSTOSCOPY  05/02/2012   Procedure: CYSTOSCOPY;  Surgeon: Sebastian Acheheodore Manny, MD;  Location: [facility];  Service: Urology;  Laterality: N/A;  removal  of foreign body    Family History  Problem Relation Age of Onset   Pancreatic cancer Mother    Heart disease Father     Social History:  reports that he has been smoking cigarettes. He has never used smokeless tobacco. He reports that he does not drink alcohol or use drugs.  Allergies: No Known Allergies  Medications: Prior to Admission medications   Medication Sig Start Date End Date Taking? Authorizing Provider  aspirin 81 MG tablet Take 81 mg by mouth daily.   Yes [provider]    lisinopril-hydrochlorothiazide (PRINZIDE,ZESTORETIC) 20-25 MG per tablet Take 1 tablet by mouth daily. 02/10/15  Yes [provider]  metoprolol succinate (TOPROL-XL) 50 MG 24 hr tablet Take 50 mg by mouth daily. Take with or immediately following a meal.   Yes [provider]  pravastatin (PRAVACHOL) 10 MG tablet Take 10 mg by mouth at bedtime.    Yes [provider]  Tamsulosin HCl (FLOMAX) 0.4 MG CAPS Take 0.4 mg by mouth daily after supper.    Yes [provider]  amoxicillin (AMOXIL) 500 MG capsule Take 500 mg by mouth 3 (three) times daily. 05/04/19   [provider]    Scheduled Meds:  aspirin  300 mg Rectal Daily   Or   aspirin  325 mg Oral Daily   atorvastatin  80 mg Oral q1800   metoprolol succinate  50 mg Oral Daily   tamsulosin  0.4 mg Oral QPC supper   Continuous Infusions:  cefTRIAXone (ROCEPHIN)  IV Stopped (05/06/19 0154)   PRN Meds:.acetaminophen **OR** acetaminophen (TYLENOL) oral liquid 160 mg/5 mL **OR** acetaminophen     Results for orders placed or performed during the hospital encounter of 05/05/19 (from the past 48 hour(s))  Ethanol     Status: None   Collection Time: 05/05/19  9:42 PM  Result Value Ref Range   Alcohol, Ethyl (B) <10 <10 mg/dL    Comment: (NOTE) Lowest detectable limit for serum alcohol is 10 mg/dL. For medical purposes only. Performed at [facility], [address] [place], [place] [address]   Protime-INR     Status: None   Collection Time: 05/05/19  9:42 PM  Result Value Ref Range   Prothrombin Time 13.9 11.4 - 15.2 seconds   INR 1.1 0.8 - 1.2    Comment: (NOTE) INR goal varies based on device and disease states. Performed at [facility], [address], [place], [place] [address]   APTT     Status: None   Collection Time: 05/05/19  9:42 PM  Result Value Ref Range   aPTT 31 24 - 36 seconds    Comment: Performed at [facility], [address] [place], [place] [address]  CBC      Status: Abnormal   Collection Time: 05/05/19  9:42 PM  Result Value Ref Range   WBC 12.0 (H) 4.0 - 10.5 K/uL   RBC 5.52 4.22 - 5.81 MIL/uL   Hemoglobin 16.5 13.0 - 17.0 g/dL   HCT 47.5 39.0 - 52.0 %   MCV 86.1 80.0 - 100.0 fL   MCH 29.9 26.0 - 34.0 pg   MCHC 34.7 30.0 - 36.0 g/dL   RDW 13.0 11.5 - 15.5 %   Platelets 280 150 - 400 K/uL   nRBC 0.0 0.0 - 0.2 %    Comment: Performed at [facility], [address], [place], [place] [address]  Differential     Status: Abnormal   Collection Time: 05/05/19  9:42 PM  Result Value Ref Range   Neutrophils Relative % 54 %   Neutro Abs 6.5 1.7 - 7.7 K/uL   Lymphocytes Relative 34 %   Lymphs Abs 4.1 (H) 0.7 - 4.0 K/uL   Monocytes Relative 9 %   Monocytes Absolute 1.1 (H) 0.1 - 1.0 K/uL   Eosinophils Relative 2 %   Eosinophils Absolute 0.3 0.0 - 0.5 K/uL   Basophils Relative 1 %   Basophils Absolute 0.1 0.0 - 0.1 K/uL   Immature Granulocytes 0 %   Abs Immature Granulocytes 0.04 0.00 - 0.07 K/uL    Comment: Performed at [facility], [address], [place], [place] [address]  Comprehensive metabolic panel     Status: Abnormal   Collection Time: 05/05/19  9:42 PM  Result Value Ref Range   Sodium 131 (L) 135 - 145 mmol/L   Potassium 2.8 (L) 3.5 - 5.1 mmol/L   Chloride 94 (L) 98 - 111 mmol/L   CO2 27 22 - 32 mmol/L   Glucose, Bld 99 70 - 99 mg/dL   BUN 11 8 - 23 mg/dL   Creatinine, Ser 4.09 0.61 - 1.24 mg/dL   Calcium 8.8 (L) 8.9 - 10.3 mg/dL   Total Protein 7.4 6.5 - 8.1 g/dL   Albumin 4.0 3.5 - 5.0 g/dL   AST 23 15 - 41 U/L   ALT 27 0 - 44 U/L   Alkaline Phosphatase 68 38 - 126 U/L   Total Bilirubin 0.7 0.3 - 1.2 mg/dL   GFR calc non Af Amer >60 >60 mL/min   GFR calc Af Amer >60 >60 mL/min   Anion gap 10 5 - 15    Comment: Performed at [facility], [address] [place], [place] [address]  Urine rapid drug screen (hosp performed)not at [facility]     Status: None   Collection Time: 05/05/19  9:42 PM  Result Value Ref Range   Opiates  NONE DETECTED NONE DETECTED   Cocaine NONE DETECTED NONE DETECTED   Benzodiazepines NONE DETECTED NONE DETECTED   Amphetamines NONE DETECTED NONE DETECTED   Tetrahydrocannabinol NONE DETECTED NONE DETECTED   Barbiturates NONE DETECTED NONE DETECTED    Comment: (NOTE) DRUG SCREEN FOR MEDICAL PURPOSES ONLY.  IF CONFIRMATION IS NEEDED FOR ANY PURPOSE, NOTIFY LAB WITHIN 5 DAYS. LOWEST DETECTABLE LIMITS FOR URINE DRUG SCREEN Drug Class                     Cutoff (ng/mL) Amphetamine and metabolites    1000 Barbiturate and metabolites    200 Benzodiazepine                 200 Tricyclics and metabolites     300 Opiates and metabolites        300 Cocaine and metabolites        300 THC                            50 Performed at [facility], [address] [place], [place] [address]   Troponin I (High Sensitivity)     Status: None   Collection Time: 05/05/19  9:42 PM  Result Value Ref Range   Troponin I (High Sensitivity) 3 <18 ng/L    Comment: (NOTE) Elevated high sensitivity troponin I (hsTnI) values and significant  changes across serial measurements may suggest ACS but many other  chronic and acute conditions are known to elevate hsTnI results.  Refer to the "Links" section for chest pain algorithms and additional  guidance. Performed at [facility], [address], [place], [place] [address]   Hemoglobin A1c     Status: None   Collection Time: 05/05/19  9:42 PM  Result Value Ref Range   Hgb A1c MFr Bld 5.4 4.8 - 5.6 %    Comment: (NOTE) Pre diabetes:          5.7%-6.4% Diabetes:              >6.4% Glycemic control for   <7.0% adults with diabetes    Mean Plasma Glucose 108.28 mg/dL    Comment: Performed at [facility], [address], [place], [place] [address]  Glucose, capillary     Status: Abnormal   Collection Time: 05/05/19  9:52 PM  Result Value Ref Range   Glucose-Capillary 104 (H) 70 - 99 mg/dL  Urinalysis, Routine w reflex microscopic     Status: Abnormal    Collection Time: 05/05/19 11:01 PM  Result Value Ref Range   Color, Urine YELLOW YELLOW   APPearance HAZY (A) CLEAR   Specific Gravity, Urine 1.006 1.005 - 1.030   pH 7.0 5.0 - 8.0   Glucose, UA NEGATIVE NEGATIVE mg/dL   Hgb urine dipstick SMALL (A) NEGATIVE   Bilirubin Urine NEGATIVE NEGATIVE   Ketones, ur NEGATIVE NEGATIVE mg/dL   Protein, ur NEGATIVE NEGATIVE mg/dL   Nitrite POSITIVE (A) NEGATIVE   Leukocytes,Ua MODERATE (A) NEGATIVE   RBC / HPF 0-5 0 - 5 RBC/hpf   WBC, UA 11-20 0 - 5 WBC/hpf   Bacteria, UA RARE (A) NONE SEEN   Squamous Epithelial / LPF 0-5 0 - 5    Comment: Performed at [facility], [address], [place], [place] [address]  Troponin I (High Sensitivity)     Status: None   Collection Time: 05/06/19 12:05 AM  Result Value Ref Range   Troponin I (High Sensitivity) 3 <18 ng/L    Comment: (NOTE) Elevated high sensitivity troponin I (hsTnI) values and significant  changes across serial measurements may suggest ACS but many other  chronic and acute conditions are known to elevate hsTnI results.  Refer to the "Links" section for chest pain algorithms and additional  guidance. Performed at [facility], [address] [place], [place] [address]   Lipid panel     Status: Abnormal   Collection Time: 05/06/19  5:10 AM  Result Value Ref Range   Cholesterol 154 0 - 200 mg/dL   Triglycerides 84 <295 mg/dL   HDL 31 (L) >62 mg/dL   Total CHOL/HDL Ratio 5.0 RATIO   VLDL 17 0 - 40 mg/dL   LDL Cholesterol 130 (H) 0 - 99 mg/dL    Comment:        Total Cholesterol/HDL:CHD Risk Coronary Heart Disease Risk Table                     Men   Women  1/2 Average Risk   3.4   3.3  Average Risk       5.0   4.4  2 X Average Risk   9.6   7.1  3 X Average Risk  23.4   11.0        Use the calculated Patient Ratio above and the CHD Risk Table to determine the patient's CHD Risk.        ATP III CLASSIFICATION (LDL):  <100     mg/dL  Optimal  100-129  mg/dL   Near or Above                     Optimal  130-159  mg/dL   Borderline  [id number]  mg/dL   High  >045>190     mg/dL   Very High Performed at [facility], [address] [place], [place] [address]   CBC     Status: Abnormal   Collection Time: 05/06/19  5:10 AM  Result Value Ref Range   WBC 12.2 (H) 4.0 - 10.5 K/uL   RBC 5.11 4.22 - 5.81 MIL/uL   Hemoglobin 15.1 13.0 - 17.0 g/dL   HCT 19.144.5 47.839.0 - 29.552.0 %   MCV 87.1 80.0 - 100.0 fL   MCH 29.5 26.0 - 34.0 pg   MCHC 33.9 30.0 - 36.0 g/dL   RDW 62.113.1 30.811.5 - 65.715.5 %   Platelets 284 150 - 400 K/uL   nRBC 0.0 0.0 - 0.2 %    Comment: Performed at [facility], [address] [place], [place] [address]  Comprehensive metabolic panel     Status: Abnormal   Collection Time: 05/06/19  5:10 AM  Result Value Ref Range   Sodium 133 (L) 135 - 145 mmol/L   Potassium 3.2 (L) 3.5 - 5.1 mmol/L   Chloride 96 (L) 98 - 111 mmol/L   CO2 27 22 - 32 mmol/L   Glucose, Bld 94 70 - 99 mg/dL   BUN 9 8 - 23 mg/dL   Creatinine, Ser 2.950.69 0.61 - 1.24 mg/dL   Calcium 8.5 (L) 8.9 - 10.3 mg/dL   Total Protein 6.8 6.5 - 8.1 g/dL   Albumin 3.6 3.5 - 5.0 g/dL   AST 21 15 - 41 U/L   ALT 24 0 - 44 U/L   Alkaline Phosphatase 59 38 - 126 U/L   Total Bilirubin 0.8 0.3 - 1.2 mg/dL   GFR calc non Af Amer >60 >60 mL/min   GFR calc Af Amer >60 >60 mL/min   Anion gap 10 5 - 15    Comment: Performed at [facility], [address] [place], [place] [address]    Studies/Results:  TTE  1. The left ventricle has normal systolic function with an ejection fraction of 60-65%. The cavity size was normal. Left ventricular diastolic Doppler parameters are consistent with impaired relaxation. Indeterminate filling pressures No evidence of  left ventricular regional wall motion abnormalities.  2. The aortic valve is tricuspid. Mild to moderate aortic annular calcification noted.  3. The mitral valve is grossly normal.  4. The tricuspid valve is grossly normal.  5. The aorta is normal unless otherwise  noted   BRAIN MRI MRA NECK MRA FINDINGS: MRI HEAD FINDINGS  Brain: Diffusion imaging shows a 9 mm acute infarction at the right lateral thalamus/posterior limb internal capsule and a 5 mm acute infarction in the left lateral thalamus/posterior limb internal capsule. 4 mm acute infarction in the left basal ganglia. No other acute insult. Old small vessel infarctions in the pons, right worse than left. No cerebellar abnormality. Cerebral hemispheres show old small vessel infarctions of the thalami, basal ganglia and throughout the cerebral hemispheric deep and subcortical white matter. No large vessel territory infarction. No mass lesion, hemorrhage, hydrocephalus or extra-axial collection.  Vascular: Major vessels at the base of the brain show flow.  Skull and upper cervical spine: Negative  Sinuses/Orbits: Mucosal inflammatory changes affecting the paranasal sinuses. Orbits negative.  Other: None  MRA  HEAD FINDINGS  Both internal carotid arteries are widely patent through the skull base. Mild atherosclerotic irregularity in the carotid siphon regions without flow limiting stenosis. The anterior and middle cerebral vessels are patent without proximal stenosis, aneurysm or vascular malformation. More distal branch vessels show some atherosclerotic irregularity.  Right vertebral artery is a large vessel widely patent to the basilar. Left vertebral artery is a small vessel that terminates in PICA. Right PICA is patent as well. No basilar stenosis. Flow is present in both superior cerebellar arteries in both posterior cerebral arteries. Distal branch vessels show atherosclerotic irregularity.  MRA NECK FINDINGS  Aortic arch appears normal. Branching pattern of the brachiocephalic vessels is normal without origin stenosis. Both common carotid arteries are widely patent to the bifurcation region. No carotid bifurcation stenosis or irregularity. Cervical internal  carotid arteries appear normal.  Both vertebral arteries are patent. No origin stenosis suspected. Both vertebral arteries appear normal through the cervical region to the foramen magnum.  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  Acute 5 mm infarction in the left lateral thalamus/posterior limb internal capsule.  Acute 4 mm infarction in the left basal ganglia.  Background pattern of extensive chronic small-vessel ischemic change elsewhere throughout the brain.  Neck MR angiography does not show any stenosis or occlusion of the neck vessels.  Intracranial MR angiography does not show any large or medium vessel occlusion or any correctable proximal stenosis. More distal branch vessels show atherosclerotic irregularity diffusely.  Multiple acute infarctions of this nature suggests that there could have been a generalized hypoperfusion event.    The brain MRI is reviewed impression.  There are 3 areas of increased signal seen on DWI.  The largest involves the posterior limb of the internal capsule and lateral aspect of the pontine region on the right side.  Remote small infarcts are also noted in the right pontine region.  Is actually a moderate size infarct involving most of the tegmentum.  There are multiple other infarcts involving the basal ganglia bilaterally.  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  There is quite confluent and extensive.  No hemorrhage appreciated.  No intracranial or extracranial occlusive disease.        Makaya Juneau A. Steven Hahn, M.D.  Diplomate, Biomedical engineerAmerican Board of Psychiatry and Neurology ( Neurology). 05/06/2019, 5:13 PM

## 2019-05-06 NOTE — TOC Initial Note (Signed)
Transition of Care [facility]) - Initial/Assessment Note    Patient Details  Name: Steven Hahn MRN: [id number] Date of Birth: May 24, 1951  Transition of Care [facility]) CM/SW Contact:    Shade Flood, LCSW Phone Number: 05/06/2019, 1:07 PM  Clinical Narrative:                  Pt admitted from home. PT and OT recommending outpatient PT and OT follow up. Shower chair is also being recommended. Spoke with pt today to discuss. Per pt, he has been at [facility] in [place] in the past and would like to go there again. Pt states he has a chair he can use in his walk in shower at home so he is not interested in having one ordered for him. Pt states he will return home with his wife at dc. Pt states he does also have a walker at home.  Contacted [facility] and was informed that orders for PT/OT eval and treat with pt diagnosis and pt DOB can be faxed to them at [phone] along with a demographic page and then they will call pt to schedule appointments.  Updated Dr. Luan Pulling of above. Dr. Luan Pulling states orders can be faxed from the hospital at the time of dc. Provided him with the fax number at his request. Weekend TOC will be available if assistance is needed.  No other TOC needs anticipated for dc.  Expected Discharge Plan: Home/Self Care Barriers to Discharge: Continued Medical Work up   Patient Goals and CMS Choice Patient states their goals for this hospitalization and ongoing recovery are:: Return home to prior level of function as soon as possible      Expected Discharge Plan and Services Expected Discharge Plan: Home/Self Care       Living arrangements for the past 2 months: Single Family Home                                      Prior Living Arrangements/Services Living arrangements for the past 2 months: Single Family Home Lives with:: Spouse Patient language and need for interpreter reviewed:: Yes Do you feel safe going back to the place where you live?: Yes       Need for Family Participation in Patient Care: No (Comment) Care giver support system in place?: Yes (comment) Current home services: DME Criminal Activity/Legal Involvement Pertinent to Current Situation/Hospitalization: No - Comment as needed  Activities of Daily Living Home Assistive Devices/Equipment: None ADL Screening (condition at time of admission) Patient's cognitive ability adequate to safely complete daily activities?: Yes Is the patient deaf or have difficulty hearing?: No Does the patient have difficulty seeing, even when wearing glasses/contacts?: No Does the patient have difficulty concentrating, remembering, or making decisions?: No Patient able to express need for assistance with ADLs?: No Does the patient have difficulty dressing or bathing?: No Independently performs ADLs?: Yes (appropriate for developmental age) Does the patient have difficulty walking or climbing stairs?: No Weakness of Legs: Left Weakness of Arms/Hands: Left  Permission Sought/Granted                  Emotional Assessment Appearance:: Appears stated age Attitude/Demeanor/Rapport: Engaged Affect (typically observed): Pleasant Orientation: : Oriented to Self, Oriented to Place, Oriented to  Time, Oriented to Situation Alcohol / Substance Use: Not Applicable Psych Involvement: No (comment)  Admission diagnosis:  Hemiparesis of left nondominant side, unspecified hemiparesis etiology (  HCC) [G81.94] Patient Active Problem List   Diagnosis Date Noted  . Stroke (HCC) 05/06/2019  . Hypokalemia 05/06/2019  . Hyponatremia 05/06/2019  . Essential hypertension 05/06/2019  . Acute lower UTI 05/06/2019  . Special screening for malignant neoplasms, colon    PCP:  Kari BaarsHawkins, Edward, MD Pharmacy:   [id number] - [place], [place] - [address] [place] [address] Phone: [phone] Fax: [phone]  [id number] - [place], [place] - [address] [facility] [address] [place] [address] Phone: [phone] Fax: [phone]     Social Determinants of Health (SDOH) Interventions    Readmission Risk Interventions Readmission Risk Prevention Plan 05/06/2019  Post Dischage Appt Not Complete  Appt Comments Dr Juanetta GoslingHawkins staff to contact pt to arrange follow up  Medication Screening Complete  Transportation Screening Complete  Some recent data might be hidden

## 2019-05-06 NOTE — Evaluation (Addendum)
Physical Therapy Evaluation Patient Details Name: Steven Hahn MRN: [id number] DOB: 10-31-1950 Today's Date: 05/06/2019   History of Present Illness  Steven Hahn  is a 68 y.o. male, w hypertension, hyperlipidemia, tobacco abuse apparently presents with c/o left sided weakness, numbness, ataxia w LNK 05/04/2019 at 18:00.    Clinical Impression  Patient able to perform all bed mobilities with modified independence. Able to ambulate and perform transfers with supervision. Patient was completely independent prior to hospital admittance. During evaluation patient was unable to ambulate a couple step with no AD or with a single point cane. Educated patient on use of RW and to use it every time he gets up for safety due to weakness and ataxia on left side. Patient lives with his wife who he feels can help manage the household as he recovers and provide him with supervision with functional activities. Patient would benefit from skilled physical therapy to focus on improving functional mobility and strength while admitted in the hospital.     Follow Up Recommendations Outpatient PT    Equipment Recommendations  Other (comment)(shower chair)    Recommendations for Other Services       Precautions / Restrictions Precautions Precautions: Fall Restrictions Weight Bearing Restrictions: No      Mobility  Bed Mobility Overal bed mobility: Modified Independent             General bed mobility comments: increased time  Transfers Overall transfer level: Needs assistance Equipment used: Rolling walker (2 wheeled) Transfers: Sit to/from Stand Sit to Stand: Supervision         General transfer comment: very uncoordinated, favors right side.  Ambulation/Gait Ambulation/Gait assistance: Supervision Gait Distance (Feet): 35 Feet Assistive device: Rolling walker (2 wheeled) Gait Pattern/deviations: Ataxic;Step-through pattern Gait velocity: decreased   General Gait Details: very  uncoordinated but able to ambulate with RW with supervision  Stairs            Wheelchair Mobility    Modified Rankin (Stroke Patients Only) Modified Rankin (Stroke Patients Only) Pre-Morbid Rankin Score: No symptoms Modified Rankin: Moderately severe disability     Balance Overall balance assessment: Mild deficits observed, not formally tested                                           Pertinent Vitals/Pain Pain Assessment: No/denies pain    Home Living Family/patient expects to be discharged to:: Private residence Living Arrangements: Spouse/significant other Available Help at Discharge: Family;Available 24 hours/day Type of Home: Mobile home Home Access: Stairs to enter Entrance Stairs-Rails: Right;Left;Can reach both Entrance Stairs-Number of Steps: 4 Home Layout: One level Home Equipment: Walker - 2 wheels;Cane - single point      Prior Function Level of Independence: Independent               Hand Dominance   Dominant Hand: Right    Extremity/Trunk Assessment   Upper Extremity Assessment Upper Extremity Assessment: Defer to OT evaluation LUE Deficits / Details: Strength: shoulder 4/5, elbow flexion 4/5, elbow extension 3/5, wrist 5/5. Grip strength WNL LUE Sensation: WNL LUE Coordination: decreased fine motor;decreased gross motor    Lower Extremity Assessment Lower Extremity Assessment: LLE deficits/detail LLE Deficits / Details: grossly 4/5 with knee extension, PF; 3/5 with knee flexion, DF, hip flexion LLE Sensation: decreased light touch(lateral aspect of lower leg and dorsal region of foot) LLE Coordination: decreased gross  motor    Cervical / Trunk Assessment Cervical / Trunk Assessment: Normal  Communication   Communication: No difficulties  Cognition Arousal/Alertness: Awake/alert Behavior During Therapy: WFL for tasks assessed/performed Overall Cognitive Status: Within Functional Limits for tasks assessed                                         General Comments General comments (skin integrity, edema, etc.): wobbly balance with min to moderate sway noted with standing in normal base of support    Exercises     Assessment/Plan    PT Assessment Patient needs continued PT services  PT Problem List Decreased strength;Decreased balance;Decreased mobility;Decreased activity tolerance;Decreased coordination       PT Treatment Interventions Gait training;Therapeutic activities;Therapeutic exercise;Stair training;Balance training;Functional mobility training    PT Goals (Current goals can be found in the Care Plan section)  Acute Rehab PT Goals Patient Stated Goal: to get better PT Goal Formulation: With patient Time For Goal Achievement: 05/20/19 Potential to Achieve Goals: Good    Frequency 7X/week   Barriers to discharge        Co-evaluation               AM-PAC PT "6 Clicks" Mobility  Outcome Measure Help needed turning from your back to your side while in a flat bed without using bedrails?: None Help needed moving from lying on your back to sitting on the side of a flat bed without using bedrails?: None Help needed moving to and from a bed to a chair (including a wheelchair)?: A Little Help needed standing up from a chair using your arms (e.g., wheelchair or bedside chair)?: A Little Help needed to walk in hospital room?: A Little Help needed climbing 3-5 steps with a railing? : A Lot 6 Click Score: 19    End of Session Equipment Utilized During Treatment: Gait belt Activity Tolerance: Patient limited by fatigue Patient left: in bed;with call bell/phone within reach Nurse Communication: Mobility status PT Visit Diagnosis: Unsteadiness on feet (R26.81);Ataxic gait (R26.0);Other abnormalities of gait and mobility (R26.89)    Time: [id number] PT Time Calculation (min) (ACUTE ONLY): 18 min   Charges:   PT Evaluation $PT Eval Moderate Complexity: 1 Mod PT  Treatments $Gait Training: 8-22 mins        Tereasa CoopMichele Tonie Vizcarrondo, DPT [facility]  [phone] office 05/06/2019, 10:54 AM

## 2019-05-06 NOTE — Progress Notes (Signed)
Subjective: This is an assumption of care note.  He was admitted early this morning with presumed stroke.  He has left-sided weakness and ataxia.  He is not having any speech abnormality and is not having any trouble swallowing.  He is right-handed.  This started about 48 hours ago now.  CT did not show a definite stroke.  He had tele-neurology consultation and recommendations are being followed  Objective: Vital signs in last 24 hours: Temp:  [97.7 F (36.5 C)-98.3 F (36.8 C)] 97.7 F (36.5 C) (08/14 0715) Pulse Rate:  [60-90] 68 (08/14 0715) Resp:  [12-20] 18 (08/14 0715) BP: (127-174)/(74-94) 165/80 (08/14 0715) SpO2:  [95 %-100 %] 95 % (08/14 0715) Weight:  [70.3 kg-71.4 kg] 71.4 kg (08/14 0100) Weight change:     Intake/Output from previous day: 08/13 0701 - 08/14 0700 In: 129.6 [I.V.:36.6; IV Piggyback:93.1] Out: 150 [Urine:150]  PHYSICAL EXAM General appearance: alert, cooperative and no distress Resp: rhonchi bilaterally Cardio: regular rate and rhythm, S1, S2 normal, no murmur, click, rub or gallop GI: soft, non-tender; bowel sounds normal; no masses,  no organomegaly Extremities: extremities normal, atraumatic, no cyanosis or edema He still has left-sided weakness.  Speech is normal Lab Results:  Results for orders placed or performed during the hospital encounter of 05/05/19 (from the past 48 hour(s))  Ethanol     Status: None   Collection Time: 05/05/19  9:42 PM  Result Value Ref Range   Alcohol, Ethyl (B) <10 <10 mg/dL    Comment: (NOTE) Lowest detectable limit for serum alcohol is 10 mg/dL. For medical purposes only. Performed at [facility], [address] [place], [place] [address]   Protime-INR     Status: None   Collection Time: 05/05/19  9:42 PM  Result Value Ref Range   Prothrombin Time 13.9 11.4 - 15.2 seconds   INR 1.1 0.8 - 1.2    Comment: (NOTE) INR goal varies based on device and disease states. Performed at [facility], [address]  [place], [place] [address]   APTT     Status: None   Collection Time: 05/05/19  9:42 PM  Result Value Ref Range   aPTT 31 24 - 36 seconds    Comment: Performed at [facility], [address] [place], [place] [address]  CBC     Status: Abnormal   Collection Time: 05/05/19  9:42 PM  Result Value Ref Range   WBC 12.0 (H) 4.0 - 10.5 K/uL   RBC 5.52 4.22 - 5.81 MIL/uL   Hemoglobin 16.5 13.0 - 17.0 g/dL   HCT 91.447.5 78.239.0 - 95.652.0 %   MCV 86.1 80.0 - 100.0 fL   MCH 29.9 26.0 - 34.0 pg   MCHC 34.7 30.0 - 36.0 g/dL   RDW 21.313.0 08.611.5 - 57.815.5 %   Platelets 280 150 - 400 K/uL   nRBC 0.0 0.0 - 0.2 %    Comment: Performed at [facility], [address] [place], [place] [address]  Differential     Status: Abnormal   Collection Time: 05/05/19  9:42 PM  Result Value Ref Range   Neutrophils Relative % 54 %   Neutro Abs 6.5 1.7 - 7.7 K/uL   Lymphocytes Relative 34 %   Lymphs Abs 4.1 (H) 0.7 - 4.0 K/uL   Monocytes Relative 9 %   Monocytes Absolute 1.1 (H) 0.1 - 1.0 K/uL   Eosinophils Relative 2 %   Eosinophils Absolute 0.3 0.0 - 0.5 K/uL   Basophils Relative 1 %  Basophils Absolute 0.1 0.0 - 0.1 K/uL   Immature Granulocytes 0 %   Abs Immature Granulocytes 0.04 0.00 - 0.07 K/uL    Comment: Performed at [facility], [address], [place], [place] [address]  Comprehensive metabolic panel     Status: Abnormal   Collection Time: 05/05/19  9:42 PM  Result Value Ref Range   Sodium 131 (L) 135 - 145 mmol/L   Potassium 2.8 (L) 3.5 - 5.1 mmol/L   Chloride 94 (L) 98 - 111 mmol/L   CO2 27 22 - 32 mmol/L   Glucose, Bld 99 70 - 99 mg/dL   BUN 11 8 - 23 mg/dL   Creatinine, Ser 6.04 0.61 - 1.24 mg/dL   Calcium 8.8 (L) 8.9 - 10.3 mg/dL   Total Protein 7.4 6.5 - 8.1 g/dL   Albumin 4.0 3.5 - 5.0 g/dL   AST 23 15 - 41 U/L   ALT 27 0 - 44 U/L   Alkaline Phosphatase 68 38 - 126 U/L   Total Bilirubin 0.7 0.3 - 1.2 mg/dL   GFR calc non Af Amer >60 >60 mL/min   GFR calc Af Amer >60 >60 mL/min   Anion gap 10 5 -  15    Comment: Performed at [facility], [address] [place], [place] [address]  Urine rapid drug screen (hosp performed)not at [facility]     Status: None   Collection Time: 05/05/19  9:42 PM  Result Value Ref Range   Opiates NONE DETECTED NONE DETECTED   Cocaine NONE DETECTED NONE DETECTED   Benzodiazepines NONE DETECTED NONE DETECTED   Amphetamines NONE DETECTED NONE DETECTED   Tetrahydrocannabinol NONE DETECTED NONE DETECTED   Barbiturates NONE DETECTED NONE DETECTED    Comment: (NOTE) DRUG SCREEN FOR MEDICAL PURPOSES ONLY.  IF CONFIRMATION IS NEEDED FOR ANY PURPOSE, NOTIFY LAB WITHIN 5 DAYS. LOWEST DETECTABLE LIMITS FOR URINE DRUG SCREEN Drug Class                     Cutoff (ng/mL) Amphetamine and metabolites    1000 Barbiturate and metabolites    200 Benzodiazepine                 200 Tricyclics and metabolites     300 Opiates and metabolites        300 Cocaine and metabolites        300 THC                            50 Performed at [facility], [address], [place], [place] [address]   Troponin I (High Sensitivity)     Status: None   Collection Time: 05/05/19  9:42 PM  Result Value Ref Range   Troponin I (High Sensitivity) 3 <18 ng/L    Comment: (NOTE) Elevated high sensitivity troponin I (hsTnI) values and significant  changes across serial measurements may suggest ACS but many other  chronic and acute conditions are known to elevate hsTnI results.  Refer to the "Links" section for chest pain algorithms and additional  guidance. Performed at [facility], [address] [place], [place] [address]   Urinalysis, Routine w reflex microscopic     Status: Abnormal   Collection Time: 05/05/19 11:01 PM  Result Value Ref Range   Color, Urine YELLOW YELLOW   APPearance HAZY (A) CLEAR   Specific Gravity, Urine 1.006 1.005 - 1.030   pH 7.0 5.0 - 8.0  Glucose, UA NEGATIVE NEGATIVE mg/dL   Hgb urine dipstick SMALL (A) NEGATIVE   Bilirubin Urine NEGATIVE NEGATIVE    Ketones, ur NEGATIVE NEGATIVE mg/dL   Protein, ur NEGATIVE NEGATIVE mg/dL   Nitrite POSITIVE (A) NEGATIVE   Leukocytes,Ua MODERATE (A) NEGATIVE   RBC / HPF 0-5 0 - 5 RBC/hpf   WBC, UA 11-20 0 - 5 WBC/hpf   Bacteria, UA RARE (A) NONE SEEN   Squamous Epithelial / LPF 0-5 0 - 5    Comment: Performed at [facility], [address] [place], [place] [address]  Troponin I (High Sensitivity)     Status: None   Collection Time: 05/06/19 12:05 AM  Result Value Ref Range   Troponin I (High Sensitivity) 3 <18 ng/L    Comment: (NOTE) Elevated high sensitivity troponin I (hsTnI) values and significant  changes across serial measurements may suggest ACS but many other  chronic and acute conditions are known to elevate hsTnI results.  Refer to the "Links" section for chest pain algorithms and additional  guidance. Performed at [facility], [address] [place], [place] [address]   Lipid panel     Status: Abnormal   Collection Time: 05/06/19  5:10 AM  Result Value Ref Range   Cholesterol 154 0 - 200 mg/dL   Triglycerides 84 <098<150 mg/dL   HDL 31 (L) >11>40 mg/dL   Total CHOL/HDL Ratio 5.0 RATIO   VLDL 17 0 - 40 mg/dL   LDL Cholesterol 914106 (H) 0 - 99 mg/dL    Comment:        Total Cholesterol/HDL:CHD Risk Coronary Heart Disease Risk Table                     Men   Women  1/2 Average Risk   3.4   3.3  Average Risk       5.0   4.4  2 X Average Risk   9.6   7.1  3 X Average Risk  23.4   11.0        Use the calculated Patient Ratio above and the CHD Risk Table to determine the patient's CHD Risk.        ATP III CLASSIFICATION (LDL):  <100     mg/dL   Optimal  [id number]  mg/dL   Near or Above                    Optimal  130-159  mg/dL   Borderline  [phone]  mg/dL   High  >578>190     mg/dL   Very High Performed at [facility], [address] [place], [place] [address]   CBC     Status: Abnormal   Collection Time: 05/06/19  5:10 AM  Result Value Ref Range   WBC 12.2 (H) 4.0 - 10.5 K/uL    RBC 5.11 4.22 - 5.81 MIL/uL   Hemoglobin 15.1 13.0 - 17.0 g/dL   HCT 95.244.5 84.139.0 - 32.452.0 %   MCV 87.1 80.0 - 100.0 fL   MCH 29.5 26.0 - 34.0 pg   MCHC 33.9 30.0 - 36.0 g/dL   RDW 40.113.1 02.711.5 - 25.315.5 %   Platelets 284 150 - 400 K/uL   nRBC 0.0 0.0 - 0.2 %    Comment: Performed at [facility], [address] [place], [place] [address]  Comprehensive metabolic panel     Status: Abnormal   Collection Time: 05/06/19  5:10 AM  Result Value Ref Range  Sodium 133 (L) 135 - 145 mmol/L   Potassium 3.2 (L) 3.5 - 5.1 mmol/L   Chloride 96 (L) 98 - 111 mmol/L   CO2 27 22 - 32 mmol/L   Glucose, Bld 94 70 - 99 mg/dL   BUN 9 8 - 23 mg/dL   Creatinine, Ser 0.69 0.61 - 1.24 mg/dL   Calcium 8.5 (L) 8.9 - 10.3 mg/dL   Total Protein 6.8 6.5 - 8.1 g/dL   Albumin 3.6 3.5 - 5.0 g/dL   AST 21 15 - 41 U/L   ALT 24 0 - 44 U/L   Alkaline Phosphatase 59 38 - 126 U/L   Total Bilirubin 0.8 0.3 - 1.2 mg/dL   GFR calc non Af Amer >60 >60 mL/min   GFR calc Af Amer >60 >60 mL/min   Anion gap 10 5 - 15    Comment: Performed at [facility], [address] [place], [place] [address]    ABGS No results for input(s): PHART, PO2ART, TCO2, HCO3 in the last 72 hours.  Invalid input(s): PCO2 CULTURES No results found for this or any previous visit (from the past 240 hour(s)). Studies/Results: Ct Head Wo Contrast  Result Date: 05/05/2019 CLINICAL DATA:  Ataxia beginning at 6 p.m. yesterday EXAM: CT HEAD WITHOUT CONTRAST TECHNIQUE: Contiguous axial images were obtained from the base of the skull through the vertex without intravenous contrast. COMPARISON:  None. FINDINGS: Brain: No evidence of acute infarction, hemorrhage, hydrocephalus, extra-axial collection or mass lesion/mass effect. Symmetric prominence of the ventricles, cisterns and sulci compatible with parenchymal volume loss. Patchy areas of white matter hypoattenuation are most compatible with chronic microvascular angiopathy. Vascular: Atherosclerotic  calcification of the carotid siphons and intradural vertebral arteries. Skull: No calvarial fracture or suspicious osseous lesion. No scalp swelling or hematoma. Sinuses/Orbits: Orbital structures are unremarkable. Paranasal sinuses and mastoid air cells are predominantly clear. Other: None. IMPRESSION: No acute intracranial abnormality. If persisting clinical concern for acute/subacute infarct, MRI could be obtained. Background of diffuse parenchymal volume loss and chronic white matter disease. Electronically Signed   By: Lovena Le M.D.   On: 05/05/2019 22:38    Medications:  Prior to Admission:  Medications Prior to Admission  Medication Sig Dispense Refill Last Dose  . aspirin 81 MG tablet Take 81 mg by mouth daily.   05/05/2019 at Unknown time  . lisinopril-hydrochlorothiazide (PRINZIDE,ZESTORETIC) 20-25 MG per tablet Take 1 tablet by mouth daily.  9 05/05/2019 at Unknown time  . metoprolol succinate (TOPROL-XL) 50 MG 24 hr tablet Take 50 mg by mouth daily. Take with or immediately following a meal.   05/05/2019 at 900  . pravastatin (PRAVACHOL) 10 MG tablet Take 10 mg by mouth at bedtime.    05/04/2019 at Unknown time  . Tamsulosin HCl (FLOMAX) 0.4 MG CAPS Take 0.4 mg by mouth daily after supper.    05/04/2019 at Unknown time  . amoxicillin (AMOXIL) 500 MG capsule Take 500 mg by mouth 3 (three) times daily.      Scheduled: . aspirin  300 mg Rectal Daily   Or  . aspirin  325 mg Oral Daily  . atorvastatin  80 mg Oral q1800  . metoprolol succinate  50 mg Oral Daily  . potassium chloride  40 mEq Oral Once  . tamsulosin  0.4 mg Oral QPC supper   Continuous: . 0.9 % NaCl with KCl 20 mEq / L 75 mL/hr at 05/06/19 0300  . cefTRIAXone (ROCEPHIN)  IV Stopped (05/06/19 0154)   XTG:GYIRSWNIOEVOJ **OR**  acetaminophen (TYLENOL) oral liquid 160 mg/5 mL **OR** acetaminophen  Assesment: He has had a stroke.  Work-up is underway.  He has hypertension at baseline and that is being treated  He has  what looks like a UTI and culture is pending  He is hypokalemic and that will be replaced Principal Problem:   Stroke [facility](HCC) Active Problems:   Hypokalemia   Hyponatremia   Essential hypertension   Acute lower UTI    Plan: He will have MRI MRA of the brain and of his neck.  Neurology consult has been requested.  PT OT and speech although he may not need the speech consultation    LOS: 0 days   Fredirick Maudlindward L Avarey Yaeger 05/06/2019, 8:38 AM

## 2019-05-06 NOTE — Plan of Care (Signed)
  Problem: Acute Rehab OT Goals (only OT should resolve) Goal: Pt. Will Perform Grooming Flowsheets (Taken 05/06/2019 1050) Pt Will Perform Grooming:  with modified independence  standing Goal: Pt. Will Perform Lower Body Dressing Flowsheets (Taken 05/06/2019 1050) Pt Will Perform Lower Body Dressing:  with modified independence  sitting/lateral leans  sit to/from stand Goal: Pt. Will Transfer To Toilet Flowsheets (Taken 05/06/2019 1050) Pt Will Transfer to Toilet:  with modified independence  ambulating  regular height toilet Goal: Pt. Will Perform Toileting-Clothing Manipulation Flowsheets (Taken 05/06/2019 1050) Pt Will Perform Toileting - Clothing Manipulation and hygiene:  with modified independence  sitting/lateral leans  sit to/from stand Goal: Pt/Caregiver Will Perform Home Exercise Program Flowsheets (Taken 05/06/2019 1050) Pt/caregiver will Perform Home Exercise Program:  Increased strength  Left upper extremity  Independently  With written HEP provided

## 2019-05-06 NOTE — H&P (Signed)
TRH H&P    Patient Demographics:    Steven Hahn Koelling, is a 68 y.o. male  MRN: [id number]  DOB - 18-May-1951  Admit Date - 05/05/2019  Referring MD/NP/PA:  Jaci Carrelhristopher Pollina  Outpatient Primary MD for the patient is Kari BaarsHawkins, Edward, MD  Patient coming from:  home  Chief complaint-  Left sided weakness   HPI:    Steven Hahn Sawin  is a 68 y.o. male, w hypertension, hyperlipidemia, tobacco abuse apparently presents with c/o left sided weakness, numbness, ataxia w LNK 05/04/2019 at 18:00.    In Ed,   CT brain IMPRESSION: No acute intracranial abnormality. If persisting clinical concern for acute/subacute infarct, MRI could be obtained.  Background of diffuse parenchymal volume loss and chronic white matter disease.  UDS negative  Urinalysis Wbc 11-20, Rbc 0-5  Trop 3  Na 131, K 2.8, Bun 11, Creatinine 0.84 Ast 23, Alt 27 Wbc 12.0, Hgb 16.5, Plt 280  INR 1.1  Teleneurology consulted by ED,   Pt will be admitted for possible CVA with left sided weakness and acute lower UTI          Review of systems:    In addition to the HPI above,  No Fever-chills, No Headache, No changes with Vision or hearing, No problems swallowing food or Liquids, No Chest pain, Cough or Shortness of Breath, No Abdominal pain, No Nausea or Vomiting, bowel movements are regular, No Blood in stool or Urine, No dysuria, No new skin rashes or bruises, No new joints pains-aches,   No recent weight gain or loss, No polyuria, polydypsia or polyphagia, No significant Mental Stressors.  All other systems reviewed and are negative.    Past History of the following :    Past Medical History:  Diagnosis Date   Hypertension       Past Surgical History:  Procedure Laterality Date   BACK SURGERY     COLONOSCOPY N/A 04/30/2015   Procedure: COLONOSCOPY;  Surgeon: Corbin Adeobert M Rourk, MD;  Location[facility];  Service: Endoscopy;  Laterality: N/A;  10:30 Am   CYSTOSCOPY  05/02/2012   Procedure: CYSTOSCOPY;  Surgeon: Sebastian Acheheodore Manny, MD;  Location: [facility];  Service: Urology;  Laterality: N/A;  removal of foreign body      Social History:      Social History   Tobacco Use   Smoking status: Current Every Day Smoker    Types: Cigarettes   Smokeless tobacco: Never Used  Substance Use Topics   Alcohol use: No       Family History :     Family History  Problem Relation Age of Onset   Pancreatic cancer Mother    Heart disease Father        Home Medications:   Prior to Admission medications   Medication Sig Start Date End Date Taking? Authorizing Provider  aspirin 81 MG tablet Take 81 mg by mouth daily.   Yes [provider]  lisinopril-hydrochlorothiazide (PRINZIDE,ZESTORETIC) 20-25 MG per tablet Take 1 tablet by mouth daily. 02/10/15  Yes [provider]  metoprolol succinate (TOPROL-XL) 50 MG 24 hr tablet Take 50 mg by mouth daily. Take with or immediately following a meal.   Yes [provider]  pravastatin (PRAVACHOL) 10 MG tablet Take 10 mg by mouth at bedtime.    Yes [provider]  Tamsulosin HCl (FLOMAX) 0.4 MG CAPS Take 0.4 mg by mouth daily after supper.    Yes [provider]  amoxicillin (AMOXIL) 500 MG capsule Take 500 mg by mouth 3 (three) times daily. 05/04/19   [provider]     Allergies:    No Known Allergies   Physical Exam:   Vitals  Blood pressure (!) 149/79, pulse 72, temperature 98 F (36.7 C), temperature source Oral, resp. rate 20, height 5\' 8"  (1.727 m), weight 71.4 kg, SpO2 96 %.  1.  General: axoxo3  2. Psychiatric: euthymic  3. Neurologic: cn2-12 intact, reflexes 2+ symmetric, diffuse with equivocal toe on the left, and downgoing toe on the right, motor 5/5 in all 4 ext, slight weakness of the left leg, and slight pronator drift of the left arm  4. HEENMT:  Anicteric,  pupils 1.22mm symmetric, direct consensual, near intact Neck: no jvd, no bruit  5. Respiratory : CTAB  6. Cardiovascular : rrr s1, s2, no m/g/r  7. Gastrointestinal:  Abd: soft, nt, nd, +bs  8. Skin:  Ext: no c/c/e,  No rash  9.Musculoskeletal:  Good ROM,  No adenopathy    Data Review:    CBC Recent Labs  Lab 05/05/19 2142  WBC 12.0*  HGB 16.5  HCT 47.5  PLT 280  MCV 86.1  MCH 29.9  MCHC 34.7  RDW 13.0  LYMPHSABS 4.1*  MONOABS 1.1*  EOSABS 0.3  BASOSABS 0.1   ------------------------------------------------------------------------------------------------------------------  Results for orders placed or performed during the hospital encounter of 05/05/19 (from the past 48 hour(s))  Ethanol     Status: None   Collection Time: 05/05/19  9:42 PM  Result Value Ref Range   Alcohol, Ethyl (B) <10 <10 mg/dL    Comment: (NOTE) Lowest detectable limit for serum alcohol is 10 mg/dL. For medical purposes only. Performed at [facility], [address], [place], [place] [address]   Protime-INR     Status: None   Collection Time: 05/05/19  9:42 PM  Result Value Ref Range   Prothrombin Time 13.9 11.4 - 15.2 seconds   INR 1.1 0.8 - 1.2    Comment: (NOTE) INR goal varies based on device and disease states. Performed at [facility], [address], [place], [place] [address]   APTT     Status: None   Collection Time: 05/05/19  9:42 PM  Result Value Ref Range   aPTT 31 24 - 36 seconds    Comment: Performed at [facility], [address] [place], [place] [address]  CBC     Status: Abnormal   Collection Time: 05/05/19  9:42 PM  Result Value Ref Range   WBC 12.0 (H) 4.0 - 10.5 K/uL   RBC 5.52 4.22 - 5.81 MIL/uL   Hemoglobin 16.5 13.0 - 17.0 g/dL   HCT 21.3 08.6 - 57.8 %   MCV 86.1 80.0 - 100.0 fL   MCH 29.9 26.0 - 34.0 pg   MCHC 34.7 30.0 - 36.0 g/dL   RDW 46.9 62.9 - 52.8 %   Platelets 280 150 - 400 K/uL   nRBC 0.0 0.0 - 0.2 %    Comment: Performed at [facility], [address] [place], [place] [address]  Differential     Status: Abnormal   Collection Time: 05/05/19  9:42 PM  Result Value Ref Range   Neutrophils Relative % 54 %   Neutro Abs 6.5 1.7 - 7.7 K/uL   Lymphocytes Relative 34 %   Lymphs Abs 4.1 (H) 0.7 - 4.0 K/uL   Monocytes Relative 9 %   Monocytes Absolute 1.1 (H) 0.1 - 1.0 K/uL   Eosinophils Relative 2 %   Eosinophils Absolute 0.3 0.0 - 0.5 K/uL   Basophils Relative 1 %   Basophils Absolute 0.1 0.0 - 0.1 K/uL   Immature Granulocytes 0 %   Abs Immature Granulocytes 0.04 0.00 - 0.07 K/uL    Comment: Performed at [facility], [address] [place], [place] [address]  Comprehensive metabolic panel     Status: Abnormal   Collection Time: 05/05/19  9:42 PM  Result Value Ref Range   Sodium 131 (L) 135 - 145 mmol/L   Potassium 2.8 (L) 3.5 - 5.1 mmol/L   Chloride 94 (L) 98 - 111 mmol/L   CO2 27 22 - 32 mmol/L   Glucose, Bld 99 70 - 99 mg/dL   BUN 11 8 - 23 mg/dL   Creatinine, Ser 0.980.84 0.61 - 1.24 mg/dL   Calcium 8.8 (L) 8.9 - 10.3 mg/dL   Total Protein 7.4 6.5 - 8.1 g/dL   Albumin 4.0 3.5 - 5.0 g/dL   AST 23 15 - 41 U/L   ALT 27 0 - 44 U/L   Alkaline Phosphatase 68 38 - 126 U/L   Total Bilirubin 0.7 0.3 - 1.2 mg/dL   GFR calc non Af Amer >60 >60 mL/min   GFR calc Af Amer >60 >60 mL/min   Anion gap 10 5 - 15    Comment: Performed at [facility], [address] [place], [place] [address]  Urine rapid drug screen (hosp performed)not at [facility]     Status: None   Collection Time: 05/05/19  9:42 PM  Result Value Ref Range   Opiates NONE DETECTED NONE DETECTED   Cocaine NONE DETECTED NONE DETECTED   Benzodiazepines NONE DETECTED NONE DETECTED   Amphetamines NONE DETECTED NONE DETECTED   Tetrahydrocannabinol NONE DETECTED NONE DETECTED   Barbiturates NONE DETECTED NONE DETECTED    Comment: (NOTE) DRUG SCREEN FOR MEDICAL PURPOSES ONLY.  IF CONFIRMATION IS NEEDED FOR ANY PURPOSE, NOTIFY LAB WITHIN 5 DAYS. LOWEST DETECTABLE  LIMITS FOR URINE DRUG SCREEN Drug Class                     Cutoff (ng/mL) Amphetamine and metabolites    1000 Barbiturate and metabolites    200 Benzodiazepine                 200 Tricyclics and metabolites     300 Opiates and metabolites        300 Cocaine and metabolites        300 THC                            50 Performed at [facility], [address] [place], [place] [address]   Troponin I (High Sensitivity)     Status: None   Collection Time: 05/05/19  9:42 PM  Result Value Ref Range   Troponin I (High Sensitivity) 3 <18 ng/L    Comment: (NOTE) Elevated high sensitivity troponin I (hsTnI) values and significant  changes across serial  measurements may suggest ACS but many other  chronic and acute conditions are known to elevate hsTnI results.  Refer to the "Links" section for chest pain algorithms and additional  guidance. Performed at [facility], [address] [place], [place] [address]   Urinalysis, Routine w reflex microscopic     Status: Abnormal   Collection Time: 05/05/19 11:01 PM  Result Value Ref Range   Color, Urine YELLOW YELLOW   APPearance HAZY (A) CLEAR   Specific Gravity, Urine 1.006 1.005 - 1.030   pH 7.0 5.0 - 8.0   Glucose, UA NEGATIVE NEGATIVE mg/dL   Hgb urine dipstick SMALL (A) NEGATIVE   Bilirubin Urine NEGATIVE NEGATIVE   Ketones, ur NEGATIVE NEGATIVE mg/dL   Protein, ur NEGATIVE NEGATIVE mg/dL   Nitrite POSITIVE (A) NEGATIVE   Leukocytes,Ua MODERATE (A) NEGATIVE   RBC / HPF 0-5 0 - 5 RBC/hpf   WBC, UA 11-20 0 - 5 WBC/hpf   Bacteria, UA RARE (A) NONE SEEN   Squamous Epithelial / LPF 0-5 0 - 5    Comment: Performed at [facility], [address] [place], [place] [address]  Troponin I (High Sensitivity)     Status: None   Collection Time: 05/06/19 12:05 AM  Result Value Ref Range   Troponin I (High Sensitivity) 3 <18 ng/L    Comment: (NOTE) Elevated high sensitivity troponin I (hsTnI) values and significant  changes across serial  measurements may suggest ACS but many other  chronic and acute conditions are known to elevate hsTnI results.  Refer to the "Links" section for chest pain algorithms and additional  guidance. Performed at [facility], [address] [place], [place] [address]     Chemistries  Recent Labs  Lab 05/05/19 2142  NA 131*  K 2.8*  CL 94*  CO2 27  GLUCOSE 99  BUN 11  CREATININE 0.84  CALCIUM 8.8*  AST 23  ALT 27  ALKPHOS 68  BILITOT 0.7   ------------------------------------------------------------------------------------------------------------------  ------------------------------------------------------------------------------------------------------------------ GFR: Estimated Creatinine Clearance: 80.3 mL/min (by C-G formula based on SCr of 0.84 mg/dL). Liver Function Tests: Recent Labs  Lab 05/05/19 2142  AST 23  ALT 27  ALKPHOS 68  BILITOT 0.7  PROT 7.4  ALBUMIN 4.0   No results for input(s): LIPASE, AMYLASE in the last 168 hours. No results for input(s): AMMONIA in the last 168 hours. Coagulation Profile: Recent Labs  Lab 05/05/19 2142  INR 1.1   Cardiac Enzymes: No results for input(s): CKTOTAL, CKMB, CKMBINDEX, TROPONINI in the last 168 hours. BNP (last 3 results) No results for input(s): PROBNP in the last 8760 hours. HbA1C: No results for input(s): HGBA1C in the last 72 hours. CBG: No results for input(s): GLUCAP in the last 168 hours. Lipid Profile: No results for input(s): CHOL, HDL, LDLCALC, TRIG, CHOLHDL, LDLDIRECT in the last 72 hours. Thyroid Function Tests: No results for input(s): TSH, T4TOTAL, FREET4, T3FREE, THYROIDAB in the last 72 hours. Anemia Panel: No results for input(s): VITAMINB12, FOLATE, FERRITIN, TIBC, IRON, RETICCTPCT in the last 72 hours.  --------------------------------------------------------------------------------------------------------------- Urine analysis:    Component Value Date/Time   COLORURINE YELLOW  05/05/2019 2301   APPEARANCEUR HAZY (A) 05/05/2019 2301   LABSPEC 1.006 05/05/2019 2301   PHURINE 7.0 05/05/2019 2301   GLUCOSEU NEGATIVE 05/05/2019 2301   HGBUR SMALL (A) 05/05/2019 2301   BILIRUBINUR NEGATIVE 05/05/2019 2301   KETONESUR NEGATIVE 05/05/2019 2301   PROTEINUR NEGATIVE 05/05/2019 2301   UROBILINOGEN 0.2 05/02/2012 1456   NITRITE POSITIVE (A)  05/05/2019 2301   LEUKOCYTESUR MODERATE (A) 05/05/2019 2301      Imaging Results:    Ct Head Wo Contrast  Result Date: 05/05/2019 CLINICAL DATA:  Ataxia beginning at 6 p.m. yesterday EXAM: CT HEAD WITHOUT CONTRAST TECHNIQUE: Contiguous axial images were obtained from the base of the skull through the vertex without intravenous contrast. COMPARISON:  None. FINDINGS: Brain: No evidence of acute infarction, hemorrhage, hydrocephalus, extra-axial collection or mass lesion/mass effect. Symmetric prominence of the ventricles, cisterns and sulci compatible with parenchymal volume loss. Patchy areas of white matter hypoattenuation are most compatible with chronic microvascular angiopathy. Vascular: Atherosclerotic calcification of the carotid siphons and intradural vertebral arteries. Skull: No calvarial fracture or suspicious osseous lesion. No scalp swelling or hematoma. Sinuses/Orbits: Orbital structures are unremarkable. Paranasal sinuses and mastoid air cells are predominantly clear. Other: None. IMPRESSION: No acute intracranial abnormality. If persisting clinical concern for acute/subacute infarct, MRI could be obtained. Background of diffuse parenchymal volume loss and chronic white matter disease. Electronically Signed   By: Lovena Le M.D.   On: 05/05/2019 22:38   ekg nsr at 78, nl axis, nl int, no st-t changes c/w ischemia     Assessment & Plan:    Principal Problem:   Stroke [facility]) Active Problems:   Hypokalemia   Hyponatremia   Essential hypertension   Acute lower UTI  Left sided weakness ? CVA Tele MRI brain MRA brain,  MRA neck Check hga1c, lipid Aspirin 325mg  po qday Lipitor 80mg  po qhs Permissive hypertension PT/OT, speech consult Neurology consult  Acute lower uti Urine culture Rocephin 1gm iv qday  Hypokalemia Replete Check cmp in am  Hypertension Cont Toprol XL 50mg  po qday Hold Lisinopril- hydrochlorothiazide to allow for permissive hypertension  Hyperlipidemia DC Pravastatin -> Lipitor 80mg  po qhs  Bph Cont Flomax 0.4mg  po qhs    DVT Prophylaxis-  SCD  AM Labs Ordered, also please review Full Orders  Family Communication: Admission, patients condition and plan of care including tests being ordered have been discussed with the patient and wife who indicate understanding and agree with the plan and Code Status.  Code Status:  FULL CODE  Admission status: Inpatient: Based on patients clinical presentation and evaluation of above clinical data, I have made determination that patient meets Inpatient criteria at this time     Time spent in minutes : 55 minutes   Jani Gravel M.D on 05/06/2019 at 2:02 AM

## 2019-05-07 LAB — BASIC METABOLIC PANEL
Anion gap: 9 (ref 5–15)
BUN: 10 mg/dL (ref 8–23)
CO2: 26 mmol/L (ref 22–32)
Calcium: 8.7 mg/dL — ABNORMAL LOW (ref 8.9–10.3)
Chloride: 105 mmol/L (ref 98–111)
Creatinine, Ser: 0.72 mg/dL (ref 0.61–1.24)
GFR calc Af Amer: >60 mL/min (ref >60)
GFR calc non Af Amer: >60 mL/min (ref >60)
Glucose, Bld: 93 mg/dL (ref 70–99)
Potassium: 3.9 mmol/L (ref 3.5–5.1)
Sodium: 140 mmol/L (ref 135–145)

## 2019-05-07 LAB — HIV ANTIBODY (ROUTINE TESTING W REFLEX): HIV Screen 4th Generation wRfx: NONREACTIVE

## 2019-05-07 MED ORDER — ATORVASTATIN CALCIUM 80 MG PO TABS: 80.0000 mg | ORAL_TABLET | Freq: Every day | ORAL | 12 refills | Status: AC

## 2019-05-07 MED ORDER — CLOPIDOGREL BISULFATE 75 MG PO TABS: 75.0000 mg | ORAL_TABLET | Freq: Every day | ORAL | 11 refills | Status: AC

## 2019-05-07 MED ORDER — ASPIRIN 325 MG PO TABS: 325.0000 mg | ORAL_TABLET | Freq: Every day | ORAL | 0 refills | Status: DC

## 2019-05-07 NOTE — Progress Notes (Signed)
IV removed, 2x2 gauze and paper tape applied to site, patient tolerated well.  Reviewed AVS with patient who verbalized understanding.  Patient transported to lobby via wheelchair and transported home by his wife.

## 2019-05-07 NOTE — Progress Notes (Signed)
Subjective: He feels better and wants to go home.  No more strokelike symptoms.  Objective: Vital signs in last 24 hours: Temp:  [97.6 F (36.4 C)-98.5 F (36.9 C)] 98.2 F (36.8 C) (08/15 0519) Pulse Rate:  [52-71] 71 (08/15 0519) Resp:  [18-20] 20 (08/15 0519) BP: (131-145)/(67-79) 142/67 (08/15 0519) SpO2:  [95 %-98 %] 98 % (08/15 0519) Weight change:  Last BM Date: 05/05/19  Intake/Output from previous day: 08/14 0701 - 08/15 0700 In: 580 [P.O.:480; IV Piggyback:100] Out: 400 [Urine:400]  PHYSICAL EXAM General appearance: alert, cooperative and no distress Resp: clear to auscultation bilaterally Cardio: regular rate and rhythm, S1, S2 normal, no murmur, click, rub or gallop GI: soft, non-tender; bowel sounds normal; no masses,  no organomegaly Extremities: extremities normal, atraumatic, no cyanosis or edema  Lab Results:  Results for orders placed or performed during the hospital encounter of 05/05/19 (from the past 48 hour(s))  Ethanol     Status: None   Collection Time: 05/05/19  9:42 PM  Result Value Ref Range   Alcohol, Ethyl (B) <10 <10 mg/dL    Comment: (NOTE) Lowest detectable limit for serum alcohol is 10 mg/dL. For medical purposes only. Performed at [facility], [address] [place], [place] [address]   Protime-INR     Status: None   Collection Time: 05/05/19  9:42 PM  Result Value Ref Range   Prothrombin Time 13.9 11.4 - 15.2 seconds   INR 1.1 0.8 - 1.2    Comment: (NOTE) INR goal varies based on device and disease states. Performed at [facility], [address] [place], [place] [address]   APTT     Status: None   Collection Time: 05/05/19  9:42 PM  Result Value Ref Range   aPTT 31 24 - 36 seconds    Comment: Performed at [facility], [address] [place], [place] [address]  CBC     Status: Abnormal   Collection Time: 05/05/19  9:42 PM  Result Value Ref Range   WBC 12.0 (H) 4.0 - 10.5 K/uL   RBC 5.52 4.22 - 5.81 MIL/uL   Hemoglobin  16.5 13.0 - 17.0 g/dL   HCT 08.647.5 57.839.0 - 46.952.0 %   MCV 86.1 80.0 - 100.0 fL   MCH 29.9 26.0 - 34.0 pg   MCHC 34.7 30.0 - 36.0 g/dL   RDW 62.913.0 52.811.5 - 41.315.5 %   Platelets 280 150 - 400 K/uL   nRBC 0.0 0.0 - 0.2 %    Comment: Performed at [facility], [address] [place], [place] [address]  Differential     Status: Abnormal   Collection Time: 05/05/19  9:42 PM  Result Value Ref Range   Neutrophils Relative % 54 %   Neutro Abs 6.5 1.7 - 7.7 K/uL   Lymphocytes Relative 34 %   Lymphs Abs 4.1 (H) 0.7 - 4.0 K/uL   Monocytes Relative 9 %   Monocytes Absolute 1.1 (H) 0.1 - 1.0 K/uL   Eosinophils Relative 2 %   Eosinophils Absolute 0.3 0.0 - 0.5 K/uL   Basophils Relative 1 %   Basophils Absolute 0.1 0.0 - 0.1 K/uL   Immature Granulocytes 0 %   Abs Immature Granulocytes 0.04 0.00 - 0.07 K/uL    Comment: Performed at [facility], [address] [place], [place] [address]  Comprehensive metabolic panel     Status: Abnormal   Collection Time: 05/05/19  9:42 PM  Result Value Ref Range   Sodium 131 (L)  135 - 145 mmol/L   Potassium 2.8 (L) 3.5 - 5.1 mmol/L   Chloride 94 (L) 98 - 111 mmol/L   CO2 27 22 - 32 mmol/L   Glucose, Bld 99 70 - 99 mg/dL   BUN 11 8 - 23 mg/dL   Creatinine, Ser 7.820.84 0.61 - 1.24 mg/dL   Calcium 8.8 (L) 8.9 - 10.3 mg/dL   Total Protein 7.4 6.5 - 8.1 g/dL   Albumin 4.0 3.5 - 5.0 g/dL   AST 23 15 - 41 U/L   ALT 27 0 - 44 U/L   Alkaline Phosphatase 68 38 - 126 U/L   Total Bilirubin 0.7 0.3 - 1.2 mg/dL   GFR calc non Af Amer >60 >60 mL/min   GFR calc Af Amer >60 >60 mL/min   Anion gap 10 5 - 15    Comment: Performed at [facility], [address] [place], [place] [address]  Urine rapid drug screen (hosp performed)not at [facility]     Status: None   Collection Time: 05/05/19  9:42 PM  Result Value Ref Range   Opiates NONE DETECTED NONE DETECTED   Cocaine NONE DETECTED NONE DETECTED   Benzodiazepines NONE DETECTED NONE DETECTED   Amphetamines NONE DETECTED NONE DETECTED    Tetrahydrocannabinol NONE DETECTED NONE DETECTED   Barbiturates NONE DETECTED NONE DETECTED    Comment: (NOTE) DRUG SCREEN FOR MEDICAL PURPOSES ONLY.  IF CONFIRMATION IS NEEDED FOR ANY PURPOSE, NOTIFY LAB WITHIN 5 DAYS. LOWEST DETECTABLE LIMITS FOR URINE DRUG SCREEN Drug Class                     Cutoff (ng/mL) Amphetamine and metabolites    1000 Barbiturate and metabolites    200 Benzodiazepine                 200 Tricyclics and metabolites     300 Opiates and metabolites        300 Cocaine and metabolites        300 THC                            50 Performed at [facility], [address] [place], [place] [address]   Troponin I (High Sensitivity)     Status: None   Collection Time: 05/05/19  9:42 PM  Result Value Ref Range   Troponin I (High Sensitivity) 3 <18 ng/L    Comment: (NOTE) Elevated high sensitivity troponin I (hsTnI) values and significant  changes across serial measurements may suggest ACS but many other  chronic and acute conditions are known to elevate hsTnI results.  Refer to the "Links" section for chest pain algorithms and additional  guidance. Performed at [facility], [address] [place], [place] [address]   HIV antibody (Routine Testing)     Status: None   Collection Time: 05/05/19  9:42 PM  Result Value Ref Range   HIV Screen 4th Generation wRfx Non Reactive Non Reactive    Comment: (NOTE) Performed At: [facility] [address] [place], [place] [id number] Jolene SchimkeNagendra Sanjai MD KG:[id number]:[phone]   Hemoglobin A1c     Status: None   Collection Time: 05/05/19  9:42 PM  Result Value Ref Range   Hgb A1c MFr Bld 5.4 4.8 - 5.6 %    Comment: (NOTE) Pre diabetes:          5.7%-6.4% Diabetes:              >  6.4% Glycemic control for   <7.0% adults with diabetes    Mean Plasma Glucose 108.28 mg/dL    Comment: Performed at [facility], [address], [place], [place] [address]  Glucose, capillary     Status: Abnormal   Collection Time:  05/05/19  9:52 PM  Result Value Ref Range   Glucose-Capillary 104 (H) 70 - 99 mg/dL  Urinalysis, Routine w reflex microscopic     Status: Abnormal   Collection Time: 05/05/19 11:01 PM  Result Value Ref Range   Color, Urine YELLOW YELLOW   APPearance HAZY (A) CLEAR   Specific Gravity, Urine 1.006 1.005 - 1.030   pH 7.0 5.0 - 8.0   Glucose, UA NEGATIVE NEGATIVE mg/dL   Hgb urine dipstick SMALL (A) NEGATIVE   Bilirubin Urine NEGATIVE NEGATIVE   Ketones, ur NEGATIVE NEGATIVE mg/dL   Protein, ur NEGATIVE NEGATIVE mg/dL   Nitrite POSITIVE (A) NEGATIVE   Leukocytes,Ua MODERATE (A) NEGATIVE   RBC / HPF 0-5 0 - 5 RBC/hpf   WBC, UA 11-20 0 - 5 WBC/hpf   Bacteria, UA RARE (A) NONE SEEN   Squamous Epithelial / LPF 0-5 0 - 5    Comment: Performed at [facility], [address] [place], [place] [address]  Troponin I (High Sensitivity)     Status: None   Collection Time: 05/06/19 12:05 AM  Result Value Ref Range   Troponin I (High Sensitivity) 3 <18 ng/L    Comment: (NOTE) Elevated high sensitivity troponin I (hsTnI) values and significant  changes across serial measurements may suggest ACS but many other  chronic and acute conditions are known to elevate hsTnI results.  Refer to the "Links" section for chest pain algorithms and additional  guidance. Performed at [facility], [address], [place], [place] [address]   SARS CORONAVIRUS 2 Nasal Swab Aptima Multi Swab     Status: None   Collection Time: 05/06/19 12:19 AM   Specimen: Aptima Multi Swab; Nasal Swab  Result Value Ref Range   SARS Coronavirus 2 NEGATIVE NEGATIVE    Comment: (NOTE) SARS-CoV-2 target nucleic acids are NOT DETECTED. The SARS-CoV-2 RNA is generally detectable in upper and lower respiratory specimens during the acute phase of infection. Negative results do not preclude SARS-CoV-2 infection, do not rule out co-infections with other pathogens, and should not be used as the sole basis for treatment or other patient  management decisions. Negative results must be combined with clinical observations, patient history, and epidemiological information. The expected result is Negative. Fact Sheet for Patients: HairSlick.no Fact Sheet for Healthcare Providers: quierodirigir.com This test is not yet approved or cleared by the [place] FDA and  has been authorized for detection and/or diagnosis of SARS-CoV-2 by FDA under an Emergency Use Authorization (EUA). This EUA will remain  in effect (meaning this test can be used) for the duration of the COVID-19 declaration under Section 56 4(b)(1) of the Act, 21 U.S.C. section 360bbb-3(b)(1), unless the authorization is terminated or revoked sooner. Performed at [facility], [address], [place], [place] [address]   Lipid panel     Status: Abnormal   Collection Time: 05/06/19  5:10 AM  Result Value Ref Range   Cholesterol 154 0 - 200 mg/dL   Triglycerides 84 <295 mg/dL   HDL 31 (L) >62 mg/dL   Total CHOL/HDL Ratio 5.0 RATIO   VLDL 17 0 - 40 mg/dL   LDL Cholesterol 130 (H) 0 - 99 mg/dL  Comment:        Total Cholesterol/HDL:CHD Risk Coronary Heart Disease Risk Table                     Men   Women  1/2 Average Risk   3.4   3.3  Average Risk       5.0   4.4  2 X Average Risk   9.6   7.1  3 X Average Risk  23.4   11.0        Use the calculated Patient Ratio above and the CHD Risk Table to determine the patient's CHD Risk.        ATP III CLASSIFICATION (LDL):  <100     mg/dL   Optimal  161-096  mg/dL   Near or Above                    Optimal  130-159  mg/dL   Borderline  045-409  mg/dL   High  >811     mg/dL   Very High Performed at [facility], [address] [place], [place] [address]   CBC     Status: Abnormal   Collection Time: 05/06/19  5:10 AM  Result Value Ref Range   WBC 12.2 (H) 4.0 - 10.5 K/uL   RBC 5.11 4.22 - 5.81 MIL/uL   Hemoglobin 15.1 13.0 - 17.0 g/dL   HCT  29.5 62.1 - 30.8 %   MCV 87.1 80.0 - 100.0 fL   MCH 29.5 26.0 - 34.0 pg   MCHC 33.9 30.0 - 36.0 g/dL   RDW 65.7 84.6 - 96.2 %   Platelets 284 150 - 400 K/uL   nRBC 0.0 0.0 - 0.2 %    Comment: Performed at [facility], [address] [place], [place] [address]  Comprehensive metabolic panel     Status: Abnormal   Collection Time: 05/06/19  5:10 AM  Result Value Ref Range   Sodium 133 (L) 135 - 145 mmol/L   Potassium 3.2 (L) 3.5 - 5.1 mmol/L   Chloride 96 (L) 98 - 111 mmol/L   CO2 27 22 - 32 mmol/L   Glucose, Bld 94 70 - 99 mg/dL   BUN 9 8 - 23 mg/dL   Creatinine, Ser 1.32 0.61 - 1.24 mg/dL   Calcium 8.5 (L) 8.9 - 10.3 mg/dL   Total Protein 6.8 6.5 - 8.1 g/dL   Albumin 3.6 3.5 - 5.0 g/dL   AST 21 15 - 41 U/L   ALT 24 0 - 44 U/L   Alkaline Phosphatase 59 38 - 126 U/L   Total Bilirubin 0.8 0.3 - 1.2 mg/dL   GFR calc non Af Amer >60 >60 mL/min   GFR calc Af Amer >60 >60 mL/min   Anion gap 10 5 - 15    Comment: Performed at [facility], [address], [place], [place] [address]  Basic metabolic panel     Status: Abnormal   Collection Time: 05/07/19  6:34 AM  Result Value Ref Range   Sodium 140 135 - 145 mmol/L    Comment: DELTA CHECK NOTED   Potassium 3.9 3.5 - 5.1 mmol/L    Comment: DELTA CHECK NOTED   Chloride 105 98 - 111 mmol/L   CO2 26 22 - 32 mmol/L   Glucose, Bld 93 70 - 99 mg/dL   BUN 10 8 - 23 mg/dL   Creatinine, Ser 2.72 0.61 - 1.24 mg/dL   Calcium 8.7 (L) 8.9 -  10.3 mg/dL   GFR calc non Af Amer >60 >60 mL/min   GFR calc Af Amer >60 >60 mL/min   Anion gap 9 5 - 15    Comment: Performed at [facility], [address], [place], [place] [address]    ABGS No results for input(s): PHART, PO2ART, TCO2, HCO3 in the last 72 hours.  Invalid input(s): PCO2 CULTURES Recent Results (from the past 240 hour(s))  SARS CORONAVIRUS 2 Nasal Swab Aptima Multi Swab     Status: None   Collection Time: 05/06/19 12:19 AM   Specimen: Aptima Multi Swab; Nasal Swab  Result Value  Ref Range Status   SARS Coronavirus 2 NEGATIVE NEGATIVE Final    Comment: (NOTE) SARS-CoV-2 target nucleic acids are NOT DETECTED. The SARS-CoV-2 RNA is generally detectable in upper and lower respiratory specimens during the acute phase of infection. Negative results do not preclude SARS-CoV-2 infection, do not rule out co-infections with other pathogens, and should not be used as the sole basis for treatment or other patient management decisions. Negative results must be combined with clinical observations, patient history, and epidemiological information. The expected result is Negative. Fact Sheet for Patients: HairSlick.no Fact Sheet for Healthcare Providers: quierodirigir.com This test is not yet approved or cleared by the [place] FDA and  has been authorized for detection and/or diagnosis of SARS-CoV-2 by FDA under an Emergency Use Authorization (EUA). This EUA will remain  in effect (meaning this test can be used) for the duration of the COVID-19 declaration under Section 56 4(b)(1) of the Act, 21 U.S.C. section 360bbb-3(b)(1), unless the authorization is terminated or revoked sooner. Performed at [facility], [address], [place], [place] [address]    Studies/Results: Ct Head Wo Contrast  Result Date: 05/05/2019 CLINICAL DATA:  Ataxia beginning at 6 p.m. yesterday EXAM: CT HEAD WITHOUT CONTRAST TECHNIQUE: Contiguous axial images were obtained from the base of the skull through the vertex without intravenous contrast. COMPARISON:  None. FINDINGS: Brain: No evidence of acute infarction, hemorrhage, hydrocephalus, extra-axial collection or mass lesion/mass effect. Symmetric prominence of the ventricles, cisterns and sulci compatible with parenchymal volume loss. Patchy areas of white matter hypoattenuation are most compatible with chronic microvascular angiopathy. Vascular: Atherosclerotic calcification of the  carotid siphons and intradural vertebral arteries. Skull: No calvarial fracture or suspicious osseous lesion. No scalp swelling or hematoma. Sinuses/Orbits: Orbital structures are unremarkable. Paranasal sinuses and mastoid air cells are predominantly clear. Other: None. IMPRESSION: No acute intracranial abnormality. If persisting clinical concern for acute/subacute infarct, MRI could be obtained. Background of diffuse parenchymal volume loss and chronic white matter disease. Electronically Signed   By: Kreg Shropshire M.D.   On: 05/05/2019 22:38   Mr Angio Head Wo Contrast  Result Date: 05/06/2019 CLINICAL DATA:  Left-sided weakness and numbness with ataxia. Last seen normal 05/04/2019 1800 hours EXAM: MRI HEAD WITHOUT AND WITH CONTRAST MRA HEAD WITHOUT CONTRAST MRA NECK WITHOUT AND WITH CONTRAST TECHNIQUE: Multiplanar, multiecho pulse sequences of the brain and surrounding structures were obtained without and with intravenous contrast. Angiographic images of the Circle of Willis were obtained using MRA technique without intravenous contrast. Angiographic images of the neck were obtained using MRA technique without and with intravenous contrast. Carotid stenosis measurements (when applicable) are obtained utilizing NASCET criteria, using the distal internal carotid diameter as the denominator. CONTRAST:  7 cc Gadavist COMPARISON:  CT 05/05/2019 FINDINGS: MRI HEAD FINDINGS Brain: Diffusion imaging shows a 9 mm acute infarction at the right lateral thalamus/posterior limb  internal capsule and a 5 mm acute infarction in the left lateral thalamus/posterior limb internal capsule. 4 mm acute infarction in the left basal ganglia. No other acute insult. Old small vessel infarctions in the pons, right worse than left. No cerebellar abnormality. Cerebral hemispheres show old small vessel infarctions of the thalami, basal ganglia and throughout the cerebral hemispheric deep and subcortical white matter. No large vessel  territory infarction. No mass lesion, hemorrhage, hydrocephalus or extra-axial collection. Vascular: Major vessels at the base of the brain show flow. Skull and upper cervical spine: Negative Sinuses/Orbits: Mucosal inflammatory changes affecting the paranasal sinuses. Orbits negative. Other: None MRA HEAD FINDINGS Both internal carotid arteries are widely patent through the skull base. Mild atherosclerotic irregularity in the carotid siphon regions without flow limiting stenosis. The anterior and middle cerebral vessels are patent without proximal stenosis, aneurysm or vascular malformation. More distal branch vessels show some atherosclerotic irregularity. Right vertebral artery is a large vessel widely patent to the basilar. Left vertebral artery is a small vessel that terminates in PICA. Right PICA is patent as well. No basilar stenosis. Flow is present in both superior cerebellar arteries in both posterior cerebral arteries. Distal branch vessels show atherosclerotic irregularity. MRA NECK FINDINGS Aortic arch appears normal. Branching pattern of the brachiocephalic vessels is normal without origin stenosis. Both common carotid arteries are widely patent to the bifurcation region. No carotid bifurcation stenosis or irregularity. Cervical internal carotid arteries appear normal. Both vertebral arteries are patent. No origin stenosis suspected. Both vertebral arteries appear normal through the cervical region to the foramen magnum. 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 Acute 5 mm infarction in the left lateral thalamus/posterior limb internal capsule. Acute 4 mm infarction in the left basal ganglia. Background pattern of extensive chronic small-vessel ischemic change elsewhere throughout the brain. Neck MR angiography does not show any stenosis or occlusion of the neck vessels. Intracranial MR angiography does not show any large or medium vessel occlusion  or any correctable proximal stenosis. More distal branch vessels show atherosclerotic irregularity diffusely. Multiple acute infarctions of this nature suggests that there could have been a generalized hypoperfusion event. Electronically Signed   By: Paulina Fusi M.D.   On: 05/06/2019 11:41   Mr Angio Neck W Wo Contrast  Result Date: 05/06/2019 CLINICAL DATA:  Left-sided weakness and numbness with ataxia. Last seen normal 05/04/2019 1800 hours EXAM: MRI HEAD WITHOUT AND WITH CONTRAST MRA HEAD WITHOUT CONTRAST MRA NECK WITHOUT AND WITH CONTRAST TECHNIQUE: Multiplanar, multiecho pulse sequences of the brain and surrounding structures were obtained without and with intravenous contrast. Angiographic images of the Circle of Willis were obtained using MRA technique without intravenous contrast. Angiographic images of the neck were obtained using MRA technique without and with intravenous contrast. Carotid stenosis measurements (when applicable) are obtained utilizing NASCET criteria, using the distal internal carotid diameter as the denominator. CONTRAST:  7 cc Gadavist COMPARISON:  CT 05/05/2019 FINDINGS: MRI HEAD FINDINGS Brain: Diffusion imaging shows a 9 mm acute infarction at the right lateral thalamus/posterior limb internal capsule and a 5 mm acute infarction in the left lateral thalamus/posterior limb internal capsule. 4 mm acute infarction in the left basal ganglia. No other acute insult. Old small vessel infarctions in the pons, right worse than left. No cerebellar abnormality. Cerebral hemispheres show old small vessel infarctions of the thalami, basal ganglia and throughout the cerebral hemispheric deep and subcortical white matter. No large vessel territory infarction. No mass lesion, hemorrhage,  hydrocephalus or extra-axial collection. Vascular: Major vessels at the base of the brain show flow. Skull and upper cervical spine: Negative Sinuses/Orbits: Mucosal inflammatory changes affecting the paranasal  sinuses. Orbits negative. Other: None MRA HEAD FINDINGS Both internal carotid arteries are widely patent through the skull base. Mild atherosclerotic irregularity in the carotid siphon regions without flow limiting stenosis. The anterior and middle cerebral vessels are patent without proximal stenosis, aneurysm or vascular malformation. More distal branch vessels show some atherosclerotic irregularity. Right vertebral artery is a large vessel widely patent to the basilar. Left vertebral artery is a small vessel that terminates in PICA. Right PICA is patent as well. No basilar stenosis. Flow is present in both superior cerebellar arteries in both posterior cerebral arteries. Distal branch vessels show atherosclerotic irregularity. MRA NECK FINDINGS Aortic arch appears normal. Branching pattern of the brachiocephalic vessels is normal without origin stenosis. Both common carotid arteries are widely patent to the bifurcation region. No carotid bifurcation stenosis or irregularity. Cervical internal carotid arteries appear normal. Both vertebral arteries are patent. No origin stenosis suspected. Both vertebral arteries appear normal through the cervical region to the foramen magnum. 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 Acute 5 mm infarction in the left lateral thalamus/posterior limb internal capsule. Acute 4 mm infarction in the left basal ganglia. Background pattern of extensive chronic small-vessel ischemic change elsewhere throughout the brain. Neck MR angiography does not show any stenosis or occlusion of the neck vessels. Intracranial MR angiography does not show any large or medium vessel occlusion or any correctable proximal stenosis. More distal branch vessels show atherosclerotic irregularity diffusely. Multiple acute infarctions of this nature suggests that there could have been a generalized hypoperfusion event. Electronically Signed   By: Nelson Chimes M.D.   On: 05/06/2019 11:41   Mr Brain Wo Contrast  Result Date: 05/06/2019 CLINICAL DATA:  Left-sided weakness and numbness with ataxia. Last seen normal 05/04/2019 1800 hours EXAM: MRI HEAD WITHOUT AND WITH CONTRAST MRA HEAD WITHOUT CONTRAST MRA NECK WITHOUT AND WITH CONTRAST TECHNIQUE: Multiplanar, multiecho pulse sequences of the brain and surrounding structures were obtained without and with intravenous contrast. Angiographic images of the Circle of Willis were obtained using MRA technique without intravenous contrast. Angiographic images of the neck were obtained using MRA technique without and with intravenous contrast. Carotid stenosis measurements (when applicable) are obtained utilizing NASCET criteria, using the distal internal carotid diameter as the denominator. CONTRAST:  7 cc Gadavist COMPARISON:  CT 05/05/2019 FINDINGS: MRI HEAD FINDINGS Brain: Diffusion imaging shows a 9 mm acute infarction at the right lateral thalamus/posterior limb internal capsule and a 5 mm acute infarction in the left lateral thalamus/posterior limb internal capsule. 4 mm acute infarction in the left basal ganglia. No other acute insult. Old small vessel infarctions in the pons, right worse than left. No cerebellar abnormality. Cerebral hemispheres show old small vessel infarctions of the thalami, basal ganglia and throughout the cerebral hemispheric deep and subcortical white matter. No large vessel territory infarction. No mass lesion, hemorrhage, hydrocephalus or extra-axial collection. Vascular: Major vessels at the base of the brain show flow. Skull and upper cervical spine: Negative Sinuses/Orbits: Mucosal inflammatory changes affecting the paranasal sinuses. Orbits negative. Other: None MRA HEAD FINDINGS Both internal carotid arteries are widely patent through the skull base. Mild atherosclerotic irregularity in the carotid siphon regions without flow limiting stenosis. The anterior and middle cerebral  vessels are patent without proximal stenosis, aneurysm or vascular malformation. More  distal branch vessels show some atherosclerotic irregularity. Right vertebral artery is a large vessel widely patent to the basilar. Left vertebral artery is a small vessel that terminates in PICA. Right PICA is patent as well. No basilar stenosis. Flow is present in both superior cerebellar arteries in both posterior cerebral arteries. Distal branch vessels show atherosclerotic irregularity. MRA NECK FINDINGS Aortic arch appears normal. Branching pattern of the brachiocephalic vessels is normal without origin stenosis. Both common carotid arteries are widely patent to the bifurcation region. No carotid bifurcation stenosis or irregularity. Cervical internal carotid arteries appear normal. Both vertebral arteries are patent. No origin stenosis suspected. Both vertebral arteries appear normal through the cervical region to the foramen magnum. 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 Acute 5 mm infarction in the left lateral thalamus/posterior limb internal capsule. Acute 4 mm infarction in the left basal ganglia. Background pattern of extensive chronic small-vessel ischemic change elsewhere throughout the brain. Neck MR angiography does not show any stenosis or occlusion of the neck vessels. Intracranial MR angiography does not show any large or medium vessel occlusion or any correctable proximal stenosis. More distal branch vessels show atherosclerotic irregularity diffusely. Multiple acute infarctions of this nature suggests that there could have been a generalized hypoperfusion event. Electronically Signed   By: Paulina FusiMark  Shogry M.D.   On: 05/06/2019 11:41    Medications:  Prior to Admission:  Medications Prior to Admission  Medication Sig Dispense Refill Last Dose  . aspirin 81 MG tablet Take 81 mg by mouth daily.   05/05/2019 at Unknown time  .  lisinopril-hydrochlorothiazide (PRINZIDE,ZESTORETIC) 20-25 MG per tablet Take 1 tablet by mouth daily.  9 05/05/2019 at Unknown time  . metoprolol succinate (TOPROL-XL) 50 MG 24 hr tablet Take 50 mg by mouth daily. Take with or immediately following a meal.   05/05/2019 at 900  . pravastatin (PRAVACHOL) 10 MG tablet Take 10 mg by mouth at bedtime.    05/04/2019 at Unknown time  . Tamsulosin HCl (FLOMAX) 0.4 MG CAPS Take 0.4 mg by mouth daily after supper.    05/04/2019 at Unknown time  . amoxicillin (AMOXIL) 500 MG capsule Take 500 mg by mouth 3 (three) times daily.      Scheduled: . aspirin  300 mg Rectal Daily   Or  . aspirin  325 mg Oral Daily  . atorvastatin  80 mg Oral q1800  . metoprolol succinate  50 mg Oral Daily  . tamsulosin  0.4 mg Oral QPC supper   Continuous: . cefTRIAXone (ROCEPHIN)  IV 1 g (05/07/19 0102)   ZOX:[id number]:acetaminophen **OR** acetaminophen (TYLENOL) oral liquid 160 mg/5 mL **OR** acetaminophen  Assesment: He was admitted with a stroke.  He has known to have hypertension.  He is improved.  Neurology consultation noted and appreciated Principal Problem:   Stroke [facility](HCC) Active Problems:   Hypokalemia   Hyponatremia   Essential hypertension   Acute lower UTI    Plan: Discharge home today he will have outpatient PT and OT.  Please see discharge summary for details    LOS: 1 day   Steven Hahn Corwin 05/07/2019, 9:15 AM

## 2019-05-07 NOTE — Discharge Summary (Signed)
Physician Discharge Summary  Patient ID: Steven RectorDaniel R Eisen MRN: [id number] DOB/AGE: 70-Jul-1952 70 y.o. Primary Care Physician:Christeen Lai, Ramon DredgeEdward, MD Admit date: 05/05/2019 Discharge date: 05/07/2019    Discharge Diagnoses:   Principal Problem:   Stroke [facility](HCC) Active Problems:   Hypokalemia   Hyponatremia   Essential hypertension   Acute lower UTI   Allergies as of 05/07/2019   No Known Allergies     Medication List    STOP taking these medications   pravastatin 10 MG tablet Commonly known as: PRAVACHOL     TAKE these medications   amoxicillin 500 MG capsule Commonly known as: AMOXIL Take 500 mg by mouth 3 (three) times daily.   aspirin 325 MG tablet Take 1 tablet (325 mg total) by mouth daily. What changed:   medication strength  how much to take   atorvastatin 80 MG tablet Commonly known as: LIPITOR Take 1 tablet (80 mg total) by mouth daily at 6 PM.   clopidogrel 75 MG tablet Commonly known as: Plavix Take 1 tablet (75 mg total) by mouth daily.   lisinopril-hydrochlorothiazide 20-25 MG tablet Commonly known as: ZESTORETIC Take 1 tablet by mouth daily.   metoprolol succinate 50 MG 24 hr tablet Commonly known as: TOPROL-XL Take 50 mg by mouth daily. Take with or immediately following a meal.   tamsulosin 0.4 MG Caps capsule Commonly known as: FLOMAX Take 0.4 mg by mouth daily after supper.       Discharged Condition: Improved    Consults: Neurology Dr. Gerilyn Pilgrimoonquah  Significant Diagnostic Studies: Ct Head Wo Contrast  Result Date: 05/05/2019 CLINICAL DATA:  Ataxia beginning at 6 p.m. yesterday EXAM: CT HEAD WITHOUT CONTRAST TECHNIQUE: Contiguous axial images were obtained from the base of the skull through the vertex without intravenous contrast. COMPARISON:  None. FINDINGS: Brain: No evidence of acute infarction, hemorrhage, hydrocephalus, extra-axial collection or mass lesion/mass effect. Symmetric prominence of the ventricles, cisterns and sulci  compatible with parenchymal volume loss. Patchy areas of white matter hypoattenuation are most compatible with chronic microvascular angiopathy. Vascular: Atherosclerotic calcification of the carotid siphons and intradural vertebral arteries. Skull: No calvarial fracture or suspicious osseous lesion. No scalp swelling or hematoma. Sinuses/Orbits: Orbital structures are unremarkable. Paranasal sinuses and mastoid air cells are predominantly clear. Other: None. IMPRESSION: No acute intracranial abnormality. If persisting clinical concern for acute/subacute infarct, MRI could be obtained. Background of diffuse parenchymal volume loss and chronic white matter disease. Electronically Signed   By: Kreg ShropshirePrice  DeHay M.D.   On: 05/05/2019 22:38   Mr Angio Head Wo Contrast  Result Date: 05/06/2019 CLINICAL DATA:  Left-sided weakness and numbness with ataxia. Last seen normal 05/04/2019 1800 hours EXAM: MRI HEAD WITHOUT AND WITH CONTRAST MRA HEAD WITHOUT CONTRAST MRA NECK WITHOUT AND WITH CONTRAST TECHNIQUE: Multiplanar, multiecho pulse sequences of the brain and surrounding structures were obtained without and with intravenous contrast. Angiographic images of the Circle of Willis were obtained using MRA technique without intravenous contrast. Angiographic images of the neck were obtained using MRA technique without and with intravenous contrast. Carotid stenosis measurements (when applicable) are obtained utilizing NASCET criteria, using the distal internal carotid diameter as the denominator. CONTRAST:  7 cc Gadavist COMPARISON:  CT 05/05/2019 FINDINGS: MRI HEAD FINDINGS Brain: Diffusion imaging shows a 9 mm acute infarction at the right lateral thalamus/posterior limb internal capsule and a 5 mm acute infarction in the left lateral thalamus/posterior limb internal capsule. 4 mm acute infarction in the left basal ganglia. No other acute insult. Old small vessel  infarctions in the pons, right worse than left. No cerebellar  abnormality. Cerebral hemispheres show old small vessel infarctions of the thalami, basal ganglia and throughout the cerebral hemispheric deep and subcortical white matter. No large vessel territory infarction. No mass lesion, hemorrhage, hydrocephalus or extra-axial collection. Vascular: Major vessels at the base of the brain show flow. Skull and upper cervical spine: Negative Sinuses/Orbits: Mucosal inflammatory changes affecting the paranasal sinuses. Orbits negative. Other: None MRA HEAD FINDINGS Both internal carotid arteries are widely patent through the skull base. Mild atherosclerotic irregularity in the carotid siphon regions without flow limiting stenosis. The anterior and middle cerebral vessels are patent without proximal stenosis, aneurysm or vascular malformation. More distal branch vessels show some atherosclerotic irregularity. Right vertebral artery is a large vessel widely patent to the basilar. Left vertebral artery is a small vessel that terminates in PICA. Right PICA is patent as well. No basilar stenosis. Flow is present in both superior cerebellar arteries in both posterior cerebral arteries. Distal branch vessels show atherosclerotic irregularity. MRA NECK FINDINGS Aortic arch appears normal. Branching pattern of the brachiocephalic vessels is normal without origin stenosis. Both common carotid arteries are widely patent to the bifurcation region. No carotid bifurcation stenosis or irregularity. Cervical internal carotid arteries appear normal. Both vertebral arteries are patent. No origin stenosis suspected. Both vertebral arteries appear normal through the cervical region to the foramen magnum. 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 Acute 5 mm infarction in the left lateral thalamus/posterior limb internal capsule. Acute 4 mm infarction in the left basal ganglia. Background pattern of extensive chronic small-vessel ischemic change  elsewhere throughout the brain. Neck MR angiography does not show any stenosis or occlusion of the neck vessels. Intracranial MR angiography does not show any large or medium vessel occlusion or any correctable proximal stenosis. More distal branch vessels show atherosclerotic irregularity diffusely. Multiple acute infarctions of this nature suggests that there could have been a generalized hypoperfusion event. Electronically Signed   By: Nelson Chimes M.D.   On: 05/06/2019 11:41   Mr Angio Neck W Wo Contrast  Result Date: 05/06/2019 CLINICAL DATA:  Left-sided weakness and numbness with ataxia. Last seen normal 05/04/2019 1800 hours EXAM: MRI HEAD WITHOUT AND WITH CONTRAST MRA HEAD WITHOUT CONTRAST MRA NECK WITHOUT AND WITH CONTRAST TECHNIQUE: Multiplanar, multiecho pulse sequences of the brain and surrounding structures were obtained without and with intravenous contrast. Angiographic images of the Circle of Willis were obtained using MRA technique without intravenous contrast. Angiographic images of the neck were obtained using MRA technique without and with intravenous contrast. Carotid stenosis measurements (when applicable) are obtained utilizing NASCET criteria, using the distal internal carotid diameter as the denominator. CONTRAST:  7 cc Gadavist COMPARISON:  CT 05/05/2019 FINDINGS: MRI HEAD FINDINGS Brain: Diffusion imaging shows a 9 mm acute infarction at the right lateral thalamus/posterior limb internal capsule and a 5 mm acute infarction in the left lateral thalamus/posterior limb internal capsule. 4 mm acute infarction in the left basal ganglia. No other acute insult. Old small vessel infarctions in the pons, right worse than left. No cerebellar abnormality. Cerebral hemispheres show old small vessel infarctions of the thalami, basal ganglia and throughout the cerebral hemispheric deep and subcortical white matter. No large vessel territory infarction. No mass lesion, hemorrhage, hydrocephalus or  extra-axial collection. Vascular: Major vessels at the base of the brain show flow. Skull and upper cervical spine: Negative Sinuses/Orbits: Mucosal inflammatory changes affecting the paranasal sinuses. Orbits negative.  Other: None MRA HEAD FINDINGS Both internal carotid arteries are widely patent through the skull base. Mild atherosclerotic irregularity in the carotid siphon regions without flow limiting stenosis. The anterior and middle cerebral vessels are patent without proximal stenosis, aneurysm or vascular malformation. More distal branch vessels show some atherosclerotic irregularity. Right vertebral artery is a large vessel widely patent to the basilar. Left vertebral artery is a small vessel that terminates in PICA. Right PICA is patent as well. No basilar stenosis. Flow is present in both superior cerebellar arteries in both posterior cerebral arteries. Distal branch vessels show atherosclerotic irregularity. MRA NECK FINDINGS Aortic arch appears normal. Branching pattern of the brachiocephalic vessels is normal without origin stenosis. Both common carotid arteries are widely patent to the bifurcation region. No carotid bifurcation stenosis or irregularity. Cervical internal carotid arteries appear normal. Both vertebral arteries are patent. No origin stenosis suspected. Both vertebral arteries appear normal through the cervical region to the foramen magnum. 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 Acute 5 mm infarction in the left lateral thalamus/posterior limb internal capsule. Acute 4 mm infarction in the left basal ganglia. Background pattern of extensive chronic small-vessel ischemic change elsewhere throughout the brain. Neck MR angiography does not show any stenosis or occlusion of the neck vessels. Intracranial MR angiography does not show any large or medium vessel occlusion or any correctable proximal stenosis. More distal branch vessels  show atherosclerotic irregularity diffusely. Multiple acute infarctions of this nature suggests that there could have been a generalized hypoperfusion event. Electronically Signed   By: Paulina Fusi M.D.   On: 05/06/2019 11:41   Mr Brain Wo Contrast  Result Date: 05/06/2019 CLINICAL DATA:  Left-sided weakness and numbness with ataxia. Last seen normal 05/04/2019 1800 hours EXAM: MRI HEAD WITHOUT AND WITH CONTRAST MRA HEAD WITHOUT CONTRAST MRA NECK WITHOUT AND WITH CONTRAST TECHNIQUE: Multiplanar, multiecho pulse sequences of the brain and surrounding structures were obtained without and with intravenous contrast. Angiographic images of the Circle of Willis were obtained using MRA technique without intravenous contrast. Angiographic images of the neck were obtained using MRA technique without and with intravenous contrast. Carotid stenosis measurements (when applicable) are obtained utilizing NASCET criteria, using the distal internal carotid diameter as the denominator. CONTRAST:  7 cc Gadavist COMPARISON:  CT 05/05/2019 FINDINGS: MRI HEAD FINDINGS Brain: Diffusion imaging shows a 9 mm acute infarction at the right lateral thalamus/posterior limb internal capsule and a 5 mm acute infarction in the left lateral thalamus/posterior limb internal capsule. 4 mm acute infarction in the left basal ganglia. No other acute insult. Old small vessel infarctions in the pons, right worse than left. No cerebellar abnormality. Cerebral hemispheres show old small vessel infarctions of the thalami, basal ganglia and throughout the cerebral hemispheric deep and subcortical white matter. No large vessel territory infarction. No mass lesion, hemorrhage, hydrocephalus or extra-axial collection. Vascular: Major vessels at the base of the brain show flow. Skull and upper cervical spine: Negative Sinuses/Orbits: Mucosal inflammatory changes affecting the paranasal sinuses. Orbits negative. Other: None MRA HEAD FINDINGS Both internal  carotid arteries are widely patent through the skull base. Mild atherosclerotic irregularity in the carotid siphon regions without flow limiting stenosis. The anterior and middle cerebral vessels are patent without proximal stenosis, aneurysm or vascular malformation. More distal branch vessels show some atherosclerotic irregularity. Right vertebral artery is a large vessel widely patent to the basilar. Left vertebral artery is a small vessel that terminates in PICA. Right PICA  is patent as well. No basilar stenosis. Flow is present in both superior cerebellar arteries in both posterior cerebral arteries. Distal branch vessels show atherosclerotic irregularity. MRA NECK FINDINGS Aortic arch appears normal. Branching pattern of the brachiocephalic vessels is normal without origin stenosis. Both common carotid arteries are widely patent to the bifurcation region. No carotid bifurcation stenosis or irregularity. Cervical internal carotid arteries appear normal. Both vertebral arteries are patent. No origin stenosis suspected. Both vertebral arteries appear normal through the cervical region to the foramen magnum. 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 Acute 5 mm infarction in the left lateral thalamus/posterior limb internal capsule. Acute 4 mm infarction in the left basal ganglia. Background pattern of extensive chronic small-vessel ischemic change elsewhere throughout the brain. Neck MR angiography does not show any stenosis or occlusion of the neck vessels. Intracranial MR angiography does not show any large or medium vessel occlusion or any correctable proximal stenosis. More distal branch vessels show atherosclerotic irregularity diffusely. Multiple acute infarctions of this nature suggests that there could have been a generalized hypoperfusion event. Electronically Signed   By: Paulina FusiMark  Shogry M.D.   On: 05/06/2019 11:41    Lab Results: Basic Metabolic  Panel: Recent Labs    05/06/19 0510 05/07/19 0634  NA 133* 140  K 3.2* 3.9  CL 96* 105  CO2 27 26  GLUCOSE 94 93  BUN 9 10  CREATININE 0.69 0.72  CALCIUM 8.5* 8.7*   Liver Function Tests: Recent Labs    05/05/19 2142 05/06/19 0510  AST 23 21  ALT 27 24  ALKPHOS 68 59  BILITOT 0.7 0.8  PROT 7.4 6.8  ALBUMIN 4.0 3.6     CBC: Recent Labs    05/05/19 2142 05/06/19 0510  WBC 12.0* 12.2*  NEUTROABS 6.5  --   HGB 16.5 15.1  HCT 47.5 44.5  MCV 86.1 87.1  PLT 280 284    Recent Results (from the past 240 hour(s))  SARS CORONAVIRUS 2 Nasal Swab Aptima Multi Swab     Status: None   Collection Time: 05/06/19 12:19 AM   Specimen: Aptima Multi Swab; Nasal Swab  Result Value Ref Range Status   SARS Coronavirus 2 NEGATIVE NEGATIVE Final    Comment: (NOTE) SARS-CoV-2 target nucleic acids are NOT DETECTED. The SARS-CoV-2 RNA is generally detectable in upper and lower respiratory specimens during the acute phase of infection. Negative results do not preclude SARS-CoV-2 infection, do not rule out co-infections with other pathogens, and should not be used as the sole basis for treatment or other patient management decisions. Negative results must be combined with clinical observations, patient history, and epidemiological information. The expected result is Negative. Fact Sheet for Patients: HairSlick.nohttps://www.fda.gov/media/138098/download Fact Sheet for Healthcare Providers: quierodirigir.comhttps://www.fda.gov/media/138095/download This test is not yet approved or cleared by the [place] FDA and  has been authorized for detection and/or diagnosis of SARS-CoV-2 by FDA under an Emergency Use Authorization (EUA). This EUA will remain  in effect (meaning this test can be used) for the duration of the COVID-19 declaration under Section 56 4(b)(1) of the Act, 21 U.S.C. section 360bbb-3(b)(1), unless the authorization is terminated or revoked sooner. Performed at [facility], [address] [place], [place] [address]      Hospital Course: This is a 68 year old he came to the hospital because of weakness.  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  There was concern that it might be cardioembolic so he will  be set up for a 30-day event monitor.  He improved and was back closer to baseline although he was still weak.  He had neurology consultation and it was suggested that he have dual antiplatelet coverage and have an event monitor.  He was seen by PT OT and speech and it was felt that he needed outpatient PT and OT which will be arranged.  He was hypokalemic and that is been replaced  Discharge Exam: Blood pressure (!) 142/67, pulse 71, temperature 98.2 F (36.8 C), temperature source Oral, resp. rate 20, height 5\' 8"  (1.727 m), weight 71.4 kg, SpO2 98 %. He is awake and alert.  Still generally weak.  More left side weakness than right.  Disposition: Home    Follow-up Information    [facility]   Specialty: Specialist Why: [facility] staff will contact you to schedule appointments. Please give them a call if you have not heard from them within a few days of discharge. Contact information: [address] [place] [address] [phone]           Signed: Fredirick Maudlindward L Kimberlyann Hollar   05/07/2019, 9:18 AM

## 2019-05-08 LAB — HIV ANTIBODY (ROUTINE TESTING W REFLEX): HIV Screen 4th Generation wRfx: NONREACTIVE

## 2019-05-11 ENCOUNTER — Ambulatory Visit (INDEPENDENT_AMBULATORY_CARE_PROVIDER_SITE_OTHER): Payer: Medicare HMO

## 2019-05-11 DIAGNOSIS — I639 Cerebral infarction, unspecified: Secondary | ICD-10-CM | POA: Diagnosis not present

## 2019-05-11 DIAGNOSIS — I499 Cardiac arrhythmia, unspecified: Secondary | ICD-10-CM

## 2019-05-13 ENCOUNTER — Other Ambulatory Visit: Payer: Self-pay

## 2019-05-13 NOTE — Patient Outreach (Signed)
[facility]) Care Management  05/13/2019  Steven Hahn Jan 20, 1951 [id number]  EMMI: general discharge red alert Referral date: 05/13/19 Referral reason: Know who to call about changes in condition Insurance: [place] [employer] # 4  Telephone call to patient regarding EMMI general discharge red alert. HIPAA verified with patient. RNCM explained reason for call.  Patient states he knows to call his primary MD regarding any changes in his condition.  Patient states she has a scheduled follow up visit with his primary MD on 05/18/19 and an evaluation visit for outpatient physical/ occupational therapy on 05/18/19.  Patient states he has and is wearing his 30 day event monitor. Patient reports taking his medications as prescribed.  Patient reports he has good family support. He states his sons will be building him a outside ramp to ambulate in and out of his home.  Patient states he has transportation to his doctor appointments.  Patient denies having any new symptoms. He reports he continues to have weakness in his left leg and numbness in his left arm.  Patient states he is using a walker for ambulation in his home.  RNCM discussed fall safety precautions with patient. RNCM discussed signs/ symptoms of stroke with patient. Advised that 911 should be called for stroke like symptoms. Patient verbalized understanding.  Patient denies any further needs at this time. RNCM offered to provide patient with 24 Hour nurse advise line phone number.  Patient request information be mailed to him   PLAN: RNCM will close case due to patient being assessed and having no further needs.  RNCM will mail patient [facility] care management brochure/ magnet with 24 hour nurse advise line number.   [name] RN,BSN,CCM [facility] Telephonic  [phone]

## 2019-05-18 ENCOUNTER — Encounter: Payer: Self-pay | Admitting: Physical Therapy

## 2019-05-18 ENCOUNTER — Other Ambulatory Visit: Payer: Self-pay

## 2019-05-18 ENCOUNTER — Ambulatory Visit: Payer: Medicare HMO | Attending: Pulmonary Disease | Admitting: Physical Therapy

## 2019-05-18 DIAGNOSIS — R2689 Other abnormalities of gait and mobility: Secondary | ICD-10-CM | POA: Diagnosis not present

## 2019-05-18 DIAGNOSIS — M6281 Muscle weakness (generalized): Secondary | ICD-10-CM

## 2019-05-18 DIAGNOSIS — R260 Ataxic gait: Secondary | ICD-10-CM | POA: Diagnosis not present

## 2019-05-18 DIAGNOSIS — I10 Essential (primary) hypertension: Secondary | ICD-10-CM | POA: Diagnosis not present

## 2019-05-18 DIAGNOSIS — E785 Hyperlipidemia, unspecified: Secondary | ICD-10-CM | POA: Diagnosis not present

## 2019-05-18 DIAGNOSIS — R29818 Other symptoms and signs involving the nervous system: Secondary | ICD-10-CM | POA: Diagnosis not present

## 2019-05-18 DIAGNOSIS — R2681 Unsteadiness on feet: Secondary | ICD-10-CM

## 2019-05-18 DIAGNOSIS — I639 Cerebral infarction, unspecified: Secondary | ICD-10-CM | POA: Diagnosis not present

## 2019-05-18 DIAGNOSIS — G8194 Hemiplegia, unspecified affecting left nondominant side: Secondary | ICD-10-CM | POA: Diagnosis not present

## 2019-05-19 NOTE — Therapy (Signed)
[facility] [address] [place], [place], [address] Phone: [phone]   Fax:  [phone]  Physical Therapy Evaluation  Patient Details  Name: Steven Hahn MRN: [id number] Date of Birth: 15-Apr-1951 Referring Provider (PT): Kari Baars, MD   Encounter Date: 05/18/2019  PT End of Session - 05/19/19 1242    Visit Number  1    Number of Visits  17    Date for PT Re-Evaluation  07/18/19    Authorization Type  [employer]na Medicare, VL: follow Medicare guidelines    PT Start Time  0330    PT Stop Time  0413    PT Time Calculation (min)  43 min    Equipment Utilized During Treatment  Gait belt    Activity Tolerance  Patient tolerated treatment well;Patient limited by fatigue    Behavior During Therapy  WFL for tasks assessed/performed       Past Medical History:  Diagnosis Date  . Hypertension     Past Surgical History:  Procedure Laterality Date  . BACK SURGERY    . COLONOSCOPY N/A 04/30/2015   Procedure: COLONOSCOPY;  Surgeon: Corbin Ade, MD;  [facility];  Service: Endoscopy;  Laterality: N/A;  10:30 Am  . CYSTOSCOPY  05/02/2012   Procedure: CYSTOSCOPY;  Surgeon: Sebastian Ache, MD;  Location: [facility];  Service: Urology;  Laterality: N/A;  removal of foreign body    There were no vitals filed for this visit.   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 L arm is numb medially up into the armpit, difficulty with picking up objects, has to think before grabbing them. States that he gets a little wobbly when he doesn't have the RW, feels like his L leg is gonna give out. Has mostly been walking in the house 50 ft to and from the bathroom. No falls since leaving the hospital. L leg doesn't feel numb, its as if it doesn't wanna cooperate, feels like he might fall with it if he is not careful.    Pertinent History  Bilateral small infarcts, hypertension,  hyperlipidemia, tobacco    How long can you walk comfortably?  about 100'    Diagnostic tests  MRI: Bilateral small infarcts - Acute 9 mm infarction at the right lateral thalamus/posterior limb, Acute 5 mm infarction in the left lateral thalamus/posterior limb, acute 4 mm infarction in the left basal ganglia.    Patient Stated Goals  wants to get rid of RW, better walking    Currently in Pain?  No/denies         [facility] PT Assessment - 05/18/19 1540      Assessment   Medical Diagnosis  Bilateral CVAs    Referring Provider (PT)  Kari Baars, MD    Onset Date/Surgical Date  05/05/19    Hand Dominance  Right    Prior Therapy  had prior therapy 8-10 years ago, back surgery       Precautions   Precautions  Fall      Balance Screen   Has the patient fallen in the past 6 months  No    Has the patient had a decrease in activity level because of a fear of falling?   No    Is the patient reluctant to leave their home because of a fear of falling?   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     Leisure  going for walks in the park, getting out of the house       Sensation   Light Touch  Appears Intact    Hot/Cold  Appears Intact   per pt report     Coordination   Gross Motor Movements are Fluid and Coordinated  No    Fine Motor Movements are Fluid and Coordinated  No    Finger Nose Finger Test  dysmetric with overshooting B, worse L>R, per pt report he has improved since being in the hospital     Heel Shin Test  dysmetric L > R      Posture/Postural Control   Posture/Postural Control  Postural limitations    Postural Limitations  Forward head;Rounded Shoulders;Posterior pelvic tilt      ROM / Strength   AROM / PROM / Strength  Strength      Strength   Strength Assessment Site  Hip;Knee;Ankle    Right/Left Hip  Right;Left    Right Hip Flexion  4+/5    Left Hip Flexion  3+/5   with decreased core control    Right/Left Knee  Right;Left    Right Knee Flexion  5/5    Right Knee Extension  5/5    Left Knee Flexion  4/5    Left Knee Extension  4/5    Right/Left Ankle  Right;Left    Right Ankle Dorsiflexion  5/5    Left Ankle Dorsiflexion  4+/5      Ambulation/Gait   Ambulation/Gait  Yes    Ambulation/Gait Assistance  4: Min guard    Ambulation Distance (Feet)  115 Feet    Assistive device  Rolling walker    Gait Pattern  Decreased step length - right;Decreased stance time - left;Decreased hip/knee flexion - left;Ataxic;Trunk flexed    Ambulation Surface  Level;Indoor      Standardized Balance Assessment   Standardized Balance Assessment  Timed Up and Go Test;Berg Balance Test      Berg Balance Test   Sit to Stand  Able to stand  independently using hands    Standing Unsupported  Able to stand 2 minutes with supervision    Sitting with Back Unsupported but Feet Supported on Floor or Stool  Able to sit safely and securely 2 minutes    Stand to Sit  Controls descent by using hands    Transfers  Able to transfer safely, definite need of hands    Standing Unsupported with Eyes Closed  Able to stand 10 seconds with supervision    Standing Unsupported with Feet Together  Needs help to attain position but able to stand for 30 seconds with feet together    From Standing, Reach Forward with Outstretched Arm  Reaches forward but needs supervision    From Standing Position, Pick up Object from Floor  Able to pick up shoe, needs supervision  From Standing Position, Turn to Look  Behind Over each Shoulder  Needs supervision when turning    Turn 360 Degrees  Needs close supervision or verbal cueing    Standing Unsupported, Alternately Place Feet on Step/Stool  Needs assistance to keep from falling or unable to try    Standing Unsupported, One Foot in [place] help to step but can hold 15 seconds   22 seconds   Standing on One Leg  Able to lift leg independently and hold equal to or more than 3 seconds   RLE   Total Score  29    Berg comment:  29/56      Timed Up and Go Test   Normal TUG (seconds)  27.09   high fall risk               Objective measurements completed on examination: See above findings.              PT Education - 05/19/19 1242    Education Details  clinical findings, fall risk, POC    Person(s) Educated  Patient    Methods  Explanation    Comprehension  Verbalized understanding       PT Short Term Goals - 05/19/19 1649      PT SHORT TERM GOAL #1   Title  Patient will be independent with initial HEP in order to build upon functional gains in therapy. ALL STGS DUE 06/16/19    Time  4    Period  Weeks    Status  New    Target Date  06/16/19      PT SHORT TERM GOAL #2   Title  Pt will undergo further assessment of 5x sit to stand and gait speed and write LTGs, as approrpiate.    Time  4    Period  Weeks    Status  New      PT SHORT TERM GOAL #3   Title  Patient will increase BERG score to at least a 35/56 in order to decrease risk of falls.    Baseline  29/56    Time  4    Period  Weeks    Status  New      PT SHORT TERM GOAL #4   Title  Patient will decrease TUG score using LRAD to at least 24 seconds in order to decrease risk of falls.    Baseline  27.09 seconds    Time  4    Period  Weeks    Status  New      PT SHORT TERM GOAL #5   Title  Patient will ambulate at least 200' on level indoor sufaces with LRAD with supervision in order to improve household mobility.    Time  4    Period  Weeks     Status  New        PT Long Term Goals - 05/19/19 1651      PT LONG TERM GOAL #1   Title  Patient will be independent with final HEP in order to build upon functional gains in therapy. ALL LTGS DUE 07/14/19.    Time  8    Period  Weeks    Status  New    Target Date  07/14/19      PT LONG TERM GOAL #2   Title  Patient will increase BERG score to at least a 42/56 in order to decrease risk of falls.    Time  8    Period  Weeks    Status  New      PT LONG TERM GOAL #3   Title  Patient will decrease TUG score using LRAD to at least 20 seconds in order to decrease risk of falls.    Time  8    Period  Weeks    Status  New      PT LONG TERM GOAL #4   Title  Patient will ambulate at least 300' on onlevel outdoor sufaces with LRAD with supervision in order to improve community mobility.    Time  8    Period  Weeks    Status  New             Plan - 05/19/19 1244    Clinical Impression Statement  Patient is a 68 year old male referred to Neuro OPPT for evaluation s/p bilateral CVAs. Pt's PMH is significant for: HTN, HLD, tobacco abuse. The following deficits were present during the exam: decreased LE strength, impaired balance, gait abnormalities, ataxic gait, decreased coordination.  Pt's TUG and BERG scores indicate pt is at a high risk for falls.  Pt would benefit from skilled PT to address these impairments and functional limitations to maximize functional mobility independence.    Personal Factors and Comorbidities  Comorbidity 1;Comorbidity 2    Comorbidities  HTN, HLD    Examination-Activity Limitations  Locomotion Level;Stand    Stability/Clinical Decision Making  Evolving/Moderate complexity    Clinical Decision Making  Moderate    Rehab Potential  Good    PT Frequency  2x / week    PT Duration  8 weeks    PT Treatment/Interventions  ADLs/Self Care Home Management;DME Instruction;Gait training;Functional mobility training;Stair training;Neuromuscular re-education;Balance  training;Therapeutic exercise;Therapeutic activities;Patient/family education;Energy conservation    PT Next Visit Plan  check gait speed, 5x sit to stand and write goals as appropriate. initial HEP for LE strength and balance    Recommended Other Services  OT consult    Consulted and Agree with Plan of Care  Patient       Patient will benefit from skilled therapeutic intervention in order to improve the following deficits and impairments:  Abnormal gait, Decreased activity tolerance, Decreased balance, Decreased endurance, Decreased coordination, Difficulty walking, Decreased strength, Postural dysfunction  Visit Diagnosis: Unsteadiness on feet  Ataxic gait  Other symptoms and signs involving the nervous system  Muscle weakness (generalized)  Other abnormalities of gait and mobility     Problem List Patient Active Problem List   Diagnosis Date Noted  . Stroke (HCC) 05/06/2019  . Hypokalemia 05/06/2019  . Hyponatremia 05/06/2019  . Essential hypertension 05/06/2019  . Acute lower UTI 05/06/2019  . Special screening for malignant neoplasms, colon     Drake Leachhloe N Prerna Harold, PT, DPT 05/19/2019, 4:53 PM  [facility] [address] [place], [place], [address] Phone: [phone]   Fax:  [phone]  Name: Steven Hahn MRN: [id number] Date of Birth: [birth year]

## 2019-05-23 ENCOUNTER — Other Ambulatory Visit: Payer: Self-pay

## 2019-05-23 ENCOUNTER — Telehealth: Payer: Self-pay | Admitting: Physical Therapy

## 2019-05-23 DIAGNOSIS — I639 Cerebral infarction, unspecified: Secondary | ICD-10-CM

## 2019-05-23 NOTE — Telephone Encounter (Signed)
Dr. Luan Pulling,  Steven Hahn has been seen by Physical Therapy at [facility]. The patient would benefit from an OT evaluation due to impaired LUE strength and coordination required for ADLs. If you agree, please place an order in [place] in Epic or fax the order to [phone].  Thank you, Janann August, PT, DPT 05/23/19 1:48 PM

## 2019-05-23 NOTE — Telephone Encounter (Signed)
Dr. Luan Pulling,  Avner Stroder has been seen by Physical Therapy at [facility]. The patient would benefit from an OT evaluation due to impaired LUE strength and coordination required for ADLs. If you agree, please place an order in [place] in Epic or fax the order to [phone].  Thank you, Janann August, PT, DPT 05/23/19 1:50 PM

## 2019-05-24 ENCOUNTER — Encounter: Payer: Self-pay | Admitting: Physical Therapy

## 2019-05-24 ENCOUNTER — Other Ambulatory Visit: Payer: Self-pay

## 2019-05-24 ENCOUNTER — Ambulatory Visit: Payer: Medicare HMO | Attending: Pulmonary Disease | Admitting: Physical Therapy

## 2019-05-24 VITALS — BP 150/85 | HR 73

## 2019-05-24 DIAGNOSIS — R260 Ataxic gait: Secondary | ICD-10-CM | POA: Diagnosis not present

## 2019-05-24 DIAGNOSIS — M6281 Muscle weakness (generalized): Secondary | ICD-10-CM | POA: Insufficient documentation

## 2019-05-24 DIAGNOSIS — R2689 Other abnormalities of gait and mobility: Secondary | ICD-10-CM | POA: Diagnosis not present

## 2019-05-24 DIAGNOSIS — R29818 Other symptoms and signs involving the nervous system: Secondary | ICD-10-CM | POA: Diagnosis not present

## 2019-05-24 DIAGNOSIS — R2681 Unsteadiness on feet: Secondary | ICD-10-CM | POA: Diagnosis not present

## 2019-05-24 NOTE — Therapy (Signed)
[facility] [address] [place], [place], [address] Phone: [phone]   Fax:  [phone]  Physical Therapy Treatment  Patient Details  Name: Steven Hahn MRN: [id number] Date of Birth: 1951/06/21 Referring Provider (PT): Sinda Du, MD   Encounter Date: 05/24/2019  PT End of Session - 05/24/19 1840    Visit Number  2    Number of Visits  17    Date for PT Re-Evaluation  07/18/19    Authorization Type  Aetna Medicare, VL: follow Medicare guidelines    PT Start Time  9937    PT Stop Time  1830    PT Time Calculation (min)  44 min    Equipment Utilized During Treatment  Gait belt    Activity Tolerance  Patient tolerated treatment well    Behavior During Therapy  [facility] for tasks assessed/performed       Past Medical History:  Diagnosis Date  . Hypertension     Past Surgical History:  Procedure Laterality Date  . BACK SURGERY    . COLONOSCOPY N/A 04/30/2015   Procedure: COLONOSCOPY;  Surgeon: Daneil Dolin, MD;  [facility];  Service: Endoscopy;  Laterality: N/A;  10:30 Am  . CYSTOSCOPY  05/02/2012   Procedure: CYSTOSCOPY;  Surgeon: Alexis Frock, MD;  Location: [facility];  Service: Urology;  Laterality: N/A;  removal of foreign body    Vitals:   05/24/19 1753  BP: (!) 150/85  Pulse: 73    Subjective Assessment - 05/24/19 1748    Subjective  Feels as if he thinks things are starting to come back (L arm is beginning to be a little less numb). Has been trying to do more walking at home with his RW. No falls. Feels himself teetering at night when going to the bathroom.    Pertinent History  Bilateral small infarcts, hypertension, hyperlipidemia, tobacco    How long can you walk comfortably?  about 100'    Diagnostic tests  MRI: Bilateral small infarcts - Acute 9 mm infarction at the right lateral thalamus/posterior limb, Acute 5 mm infarction in the left lateral thalamus/posterior limb, acute 4  mm infarction in the left basal ganglia.    Patient Stated Goals  wants to get rid of RW, better walking         [facility] PT Assessment - 05/24/19 1756      Ambulation/Gait   Gait velocity  18.32 seconds = 1.79 ft/sec      Standardized Balance Assessment   Standardized Balance Assessment  Five Times Sit to Stand    Five times sit to stand comments   21.72 seconds using BUE from chair with arm rests                [facility] Adult PT Treatment/Exercise - 05/24/19 1756      Transfers   Transfers  Sit to Stand;Stand to Sit    Five time sit to stand comments   Cueing to use one UE to push off of surface vs using BUE. Performed 3 x 5 reps sit to stand from chair without arm rests with no/single UE support. LLE staggered behind R for increased weight bearing. Cueing for technique and sequencing.       Ambulation/Gait   Ambulation/Gait  Yes    Ambulation/Gait Assistance  4: Min guard    Ambulation Distance (Feet)  100 Feet    Assistive device  Rolling walker    Gait Pattern  Decreased step  length - right;Decreased stance time - left;Decreased hip/knee flexion - left;Ataxic;Trunk flexed;Decreased stride length;Step-to pattern;Step-through pattern    Ambulation Surface  Level;Indoor           Access Code: [email or web address]  URL: [email or web address]  Date: 05/24/2019  Prepared by: Sherlie Ban   Exercises Proper Sit to Stand Technique - 5 reps - 3 sets - 2x daily - 5x weekly --> staggered stance with LLE Hooklying Single Leg Bent Knee Fallouts with Resistance - 5 reps - 3 sets - 2x daily - 7x weekly --> cueing for technique Supine Bridge with Resistance Band - 5 reps - 3 sets - 2x daily - 7x weekly --> red theraband, cueing for technique, cues for lifting first with LLE vs. R and cues for eccentric lowering.  Clamshell with Resistance - 10 reps - 2 sets - 1x daily - 7x weekly --> red theraband  Stride Stance Weight Shift - 10 reps - 2 sets - 2x daily - 7x  weekly     PT Education - 05/24/19 1840    Education Details  initial HEP    Person(s) Educated  Patient    Methods  Explanation;Demonstration;Handout    Comprehension  Verbalized understanding;Returned demonstration       PT Short Term Goals - 05/19/19 1649      PT SHORT TERM GOAL #1   Title  Patient will be independent with initial HEP in order to build upon functional gains in therapy. ALL STGS DUE 06/16/19    Time  4    Period  Weeks    Status  New    Target Date  06/16/19      PT SHORT TERM GOAL #2   Title  Pt will undergo further assessment of 5x sit to stand and gait speed and write LTGs, as approrpiate.    Time  4    Period  Weeks    Status  New      PT SHORT TERM GOAL #3   Title  Patient will increase BERG score to at least a 35/56 in order to decrease risk of falls.    Baseline  29/56    Time  4    Period  Weeks    Status  New      PT SHORT TERM GOAL #4   Title  Patient will decrease TUG score using LRAD to at least 24 seconds in order to decrease risk of falls.    Baseline  27.09 seconds    Time  4    Period  Weeks    Status  New      PT SHORT TERM GOAL #5   Title  Patient will ambulate at least 200' on level indoor sufaces with LRAD with supervision in order to improve household mobility.    Time  4    Period  Weeks    Status  New        PT Long Term Goals - 05/19/19 1651      PT LONG TERM GOAL #1   Title  Patient will be independent with final HEP in order to build upon functional gains in therapy. ALL LTGS DUE 07/18/19.    Time  8    Period  Weeks    Status  New    Target Date  07/14/19      PT LONG TERM GOAL #2   Title  Patient will increase BERG score to at least a 42/56 in order to decrease risk of falls.  Time  8    Period  Weeks    Status  New      PT LONG TERM GOAL #3   Title  Patient will decrease TUG score using LRAD to at least 20 seconds in order to decrease risk of falls.    Time  8    Period  Weeks    Status  New       PT LONG TERM GOAL #4   Title  Patient will ambulate at least 300' on onlevel outdoor sufaces with LRAD with supervision in order to improve community mobility.    Time  8    Period  Weeks    Status  New            Plan - 05/24/19 1841    Clinical Impression Statement  Focus of today's session was initiating HEP and assesssing pt's 5x sit to stand and gait speed. Pt's current gait speed puts him at a higher risk for recurrent falls. Pt able to demonstrate HEP correctly with verbal and demonstrative cues from therapist in order to increase use of LLE vs. substitution from RLE for bridging and sit <> stands. Will continue to progress towards LTGs.    Personal Factors and Comorbidities  Comorbidity 1;Comorbidity 2    Comorbidities  HTN, HLD    Examination-Activity Limitations  Locomotion Level;Stand    Stability/Clinical Decision Making  Evolving/Moderate complexity    Rehab Potential  Good    PT Frequency  2x / week    PT Duration  8 weeks    PT Treatment/Interventions  ADLs/Self Care Home Management;DME Instruction;Gait training;Functional mobility training;Stair training;Neuromuscular re-education;Balance training;Therapeutic exercise;Therapeutic activities;Patient/family education;Energy conservation    PT Next Visit Plan  check if referral for MD has come in for OT. any other exercises for initial HEP for balance. gait training (maybe try RW with tennis balls). LE strengthening and weight shifting L>R. standing balance endurance activites.    PT Home Exercise Plan  [email or web address]    Recommended Other Services  OT (sent request to MD)    Consulted and Agree with Plan of Care  Patient       Patient will benefit from skilled therapeutic intervention in order to improve the following deficits and impairments:  Abnormal gait, Decreased activity tolerance, Decreased balance, Decreased endurance, Decreased coordination, Difficulty walking, Decreased strength, Postural dysfunction  Visit  Diagnosis: Unsteadiness on feet  Ataxic gait  Other symptoms and signs involving the nervous system  Muscle weakness (generalized)  Other abnormalities of gait and mobility     Problem List Patient Active Problem List   Diagnosis Date Noted  . Stroke (HCC) 05/06/2019  . Hypokalemia 05/06/2019  . Hyponatremia 05/06/2019  . Essential hypertension 05/06/2019  . Acute lower UTI 05/06/2019  . Special screening for malignant neoplasms, colon     Drake Leachhloe N Marleah Beever  PT, DPT 05/24/2019, 6:47 PM  [facility] [address] [place], [place], [address] Phone: [phone]   Fax:  [phone]  Name: Steven Hahn MRN: [id number] Date of Birth: 1951/04/15

## 2019-05-24 NOTE — Patient Instructions (Signed)
Access Code: [email or web address]  URL: [email or web address]  Date: 05/24/2019  Prepared by: Janann August   Exercises Proper Sit to Stand Technique - 5 reps - 3 sets - 2x daily - 5x weekly Hooklying Single Leg Bent Knee Fallouts with Resistance - 5 reps                   - 3 sets - 2x daily - 7x weekly Supine Bridge with Resistance Band - 5 reps - 3 sets - 2x daily - 7x weekly Clamshell with Resistance - 10 reps - 2 sets - 1x daily - 7x weekly Stride Stance Weight Shift - 10 reps - 2 sets - 2x daily - 7x weekly

## 2019-05-27 ENCOUNTER — Encounter: Payer: Self-pay | Admitting: Physical Therapy

## 2019-05-27 ENCOUNTER — Other Ambulatory Visit: Payer: Self-pay

## 2019-05-27 ENCOUNTER — Ambulatory Visit: Payer: Medicare HMO | Admitting: Physical Therapy

## 2019-05-27 DIAGNOSIS — R29818 Other symptoms and signs involving the nervous system: Secondary | ICD-10-CM

## 2019-05-27 DIAGNOSIS — R2689 Other abnormalities of gait and mobility: Secondary | ICD-10-CM

## 2019-05-27 DIAGNOSIS — R2681 Unsteadiness on feet: Secondary | ICD-10-CM

## 2019-05-27 DIAGNOSIS — M6281 Muscle weakness (generalized): Secondary | ICD-10-CM

## 2019-05-27 DIAGNOSIS — R260 Ataxic gait: Secondary | ICD-10-CM | POA: Diagnosis not present

## 2019-05-28 NOTE — Therapy (Signed)
[facility] [address] [place], [place], [address] Phone: [phone]   Fax:  [phone]  Physical Therapy Treatment  Patient Details  Name: Steven RectorDaniel R Hahn MRN: [id number] Date of Birth: 1951-03-01 Referring Provider (PT): Kari BaarsEdward Hawkins, MD   Encounter Date: 05/27/2019  PT End of Session - 05/27/19 1405    Visit Number  3    Number of Visits  17    Date for PT Re-Evaluation  07/18/19    Authorization Type  [employer] Medicare, VL: follow Medicare guidelines    PT Start Time  1402    PT Stop Time  1441    PT Time Calculation (min)  39 min    Equipment Utilized During Treatment  Gait belt    Activity Tolerance  Patient tolerated treatment well    Behavior During Therapy  WFL for tasks assessed/performed       Past Medical History:  Diagnosis Date  . Hypertension     Past Surgical History:  Procedure Laterality Date  . BACK SURGERY    . COLONOSCOPY N/A 04/30/2015   Procedure: COLONOSCOPY;  Surgeon: Corbin Adeobert M Rourk, MD;  [facility];  Service: Endoscopy;  Laterality: N/A;  10:30 Am  . CYSTOSCOPY  05/02/2012   Procedure: CYSTOSCOPY;  Surgeon: Sebastian Acheheodore Manny, MD;  Location: [facility];  Service: Urology;  Laterality: N/A;  removal of foreign body    There were no vitals filed for this visit.  Subjective Assessment - 05/27/19 1404    Subjective  No new complaints. Did the ex's with no issues.    Pertinent History  Bilateral small infarcts, hypertension, hyperlipidemia, tobacco    How long can you walk comfortably?  about 100'    Diagnostic tests  MRI: Bilateral small infarcts - Acute 9 mm infarction at the right lateral thalamus/posterior limb, Acute 5 mm infarction in the left lateral thalamus/posterior limb, acute 4 mm infarction in the left basal ganglia.    Patient Stated Goals  wants to get rid of RW, better walking    Currently in Pain?  No/denies    Pain Score  0-No pain           [facility]  PT Treatment/Exercise - 05/27/19 1411      Transfers   Transfers  Sit to Stand;Stand to Sit;Floor to Transfer    Sit to Stand  5: Supervision;With upper extremity assist;From bed;From chair/3-in-1    Stand to Sit  5: Supervision;With upper extremity assist;To bed;To chair/3-in-1    Floor to Transfer  4: Min assist    Floor to Transfer Details (indicate cue type and reason)  to get down on red mat and back up with UE support on mat table. cues on technique.      Ambulation/Gait   Ambulation/Gait  Yes    Ambulation/Gait Assistance  5: Supervision;4: Min assist;3: Mod assist    Ambulation/Gait Assistance Details  trialed walker with tennis balls vs skies in session with pt stating he couldn't feel/tell a difference; in session had pt walk with no device/HHA for short distances (less than 15 feet) with min assist to mod assist as he fatigued.      Ambulation Distance (Feet)  115 Feet   x1, plus around gym with session with no device   Assistive device  Rolling walker    Gait Pattern  Decreased step length - right;Decreased stance time - left;Decreased hip/knee flexion - left;Ataxic;Trunk flexed;Decreased stride length;Step-to pattern;Step-through pattern    Ambulation Surface  Level;Indoor      High Level Balance   High Level Balance Activities  Side stepping;Marching forwards;Marching backwards;Tandem walking   tandem fwd/bwd   High Level Balance Comments  on floor next to counter top with UE support, min assist and cues on form/posture and weight shifting.       Neuro Re-ed    Neuro Re-ed Details   for balance/coordination/muscle re-ed: tall kneeling on red mat next to blue mat table: mini squats for 10 reps, lateral walking for 3 laps, fwd/bwd walking for 3 laps. min assist for balance with cues on form and technique.           Balance Exercises - 05/27/19 1426      Balance Exercises: Standing   Standing Eyes Closed  Wide (BOA);Head turns;Foam/compliant surface;Other reps  (comment);30 secs;Limitations    SLS with Vectors  Solid surface;Foam/compliant surface;Intermittent upper extremity assist;Other reps (comment);Limitations      Balance Exercises: Standing   Standing Eyes Closed Limitations  on airex with no UE support: EC no head movements, progressing to EC head movements left<>Right, then up<>down. up to min assist for balance    SLS with Vectors Limitations  on floor progressing to on 1 inch foam with 2 bubbles in front: alternating fwd taps, then alternating cross taps for 10 reps each. no UE support with up to min assist needed.            PT Short Term Goals - 05/19/19 1649      PT SHORT TERM GOAL #1   Title  Patient will be independent with initial HEP in order to build upon functional gains in therapy. ALL STGS DUE 06/16/19    Time  4    Period  Weeks    Status  New    Target Date  06/16/19      PT SHORT TERM GOAL #2   Title  Pt will undergo further assessment of 5x sit to stand and gait speed and write LTGs, as approrpiate.    Time  4    Period  Weeks    Status  New      PT SHORT TERM GOAL #3   Title  Patient will increase BERG score to at least a 35/56 in order to decrease risk of falls.    Baseline  29/56    Time  4    Period  Weeks    Status  New      PT SHORT TERM GOAL #4   Title  Patient will decrease TUG score using LRAD to at least 24 seconds in order to decrease risk of falls.    Baseline  27.09 seconds    Time  4    Period  Weeks    Status  New      PT SHORT TERM GOAL #5   Title  Patient will ambulate at least 200' on level indoor sufaces with LRAD with supervision in order to improve household mobility.    Time  4    Period  Weeks    Status  New        PT Long Term Goals - 05/19/19 1651      PT LONG TERM GOAL #1   Title  Patient will be independent with final HEP in order to build upon functional gains in therapy. ALL LTGS DUE 07/18/19.    Time  8    Period  Weeks    Status  New    Target Date  07/14/19       PT LONG TERM GOAL #2   Title  Patient will increase BERG score to at least a 42/56 in order to decrease risk of falls.    Time  8    Period  Weeks    Status  New      PT LONG TERM GOAL #3   Title  Patient will decrease TUG score using LRAD to at least 20 seconds in order to decrease risk of falls.    Time  8    Period  Weeks    Status  New      PT LONG TERM GOAL #4   Title  Patient will ambulate at least 300' on onlevel outdoor sufaces with LRAD with supervision in order to improve community mobility.    Time  8    Period  Weeks    Status  New            Plan - 05/27/19 1405    Clinical Impression Statement  Today's skilled session continued to focus on strengthening, coordination and balance reactions with no issues reported. The pt is progressing toward goals and should benefit from continued PT to progress toward unmet goals.    Personal Factors and Comorbidities  Comorbidity 1;Comorbidity 2    Comorbidities  HTN, HLD    Examination-Activity Limitations  Locomotion Level;Stand    Stability/Clinical Decision Making  Evolving/Moderate complexity    Rehab Potential  Good    PT Frequency  2x / week    PT Duration  8 weeks    PT Treatment/Interventions  ADLs/Self Care Home Management;DME Instruction;Gait training;Functional mobility training;Stair training;Neuromuscular re-education;Balance training;Therapeutic exercise;Therapeutic activities;Patient/family education;Energy conservation    PT Next Visit Plan  check if referral for MD has come in for OT. any other exercises for initial HEP for balance. LE strengthening and weight shifting L>R. standing balance endurance activites.    PT Home Exercise Plan  [id number]    Consulted and Agree with Plan of Care  Patient       Patient will benefit from skilled therapeutic intervention in order to improve the following deficits and impairments:  Abnormal gait, Decreased activity tolerance, Decreased balance, Decreased endurance,  Decreased coordination, Difficulty walking, Decreased strength, Postural dysfunction  Visit Diagnosis: Unsteadiness on feet  Ataxic gait  Other symptoms and signs involving the nervous system  Muscle weakness (generalized)  Other abnormalities of gait and mobility     Problem List Patient Active Problem List   Diagnosis Date Noted  . Stroke (HCC) 05/06/2019  . Hypokalemia 05/06/2019  . Hyponatremia 05/06/2019  . Essential hypertension 05/06/2019  . Acute lower UTI 05/06/2019  . Special screening for malignant neoplasms, colon     Sallyanne Kuster, PTA, [facility] [address], [address] [place], [place] [address] [phone] 05/28/19, 3:08 PM   Name: SHAWNEE PETZOLD MRN: [id number] Date of Birth: 1951/06/30

## 2019-05-31 ENCOUNTER — Other Ambulatory Visit: Payer: Self-pay

## 2019-05-31 ENCOUNTER — Encounter: Payer: Self-pay | Admitting: Physical Therapy

## 2019-05-31 ENCOUNTER — Ambulatory Visit: Payer: Medicare HMO | Admitting: Physical Therapy

## 2019-05-31 DIAGNOSIS — R2681 Unsteadiness on feet: Secondary | ICD-10-CM

## 2019-05-31 DIAGNOSIS — R29818 Other symptoms and signs involving the nervous system: Secondary | ICD-10-CM | POA: Diagnosis not present

## 2019-05-31 DIAGNOSIS — M6281 Muscle weakness (generalized): Secondary | ICD-10-CM

## 2019-05-31 DIAGNOSIS — R2689 Other abnormalities of gait and mobility: Secondary | ICD-10-CM | POA: Diagnosis not present

## 2019-05-31 DIAGNOSIS — R260 Ataxic gait: Secondary | ICD-10-CM | POA: Diagnosis not present

## 2019-05-31 NOTE — Therapy (Signed)
[place] [address] [place], [place], [address] Phone: [phone]   Fax:  [phone]  Physical Therapy Treatment  Patient Details  Name: Steven Hahn MRN: [id number] Date of Birth: Mar 25, 1951 Referring Provider (PT): Sinda Du, MD   Encounter Date: 05/31/2019  PT End of Session - 05/31/19 2130    Visit Number  4    Number of Visits  17    Date for PT Re-Evaluation  07/18/19    Authorization Type  [employer]na Medicare, VL: follow Medicare guidelines    PT Start Time  1748    PT Stop Time  1832    PT Time Calculation (min)  44 min    Equipment Utilized During Treatment  Gait belt    Activity Tolerance  Patient tolerated treatment well    Behavior During Therapy  [facility] for tasks assessed/performed       Past Medical History:  Diagnosis Date  . Hypertension     Past Surgical History:  Procedure Laterality Date  . BACK SURGERY    . COLONOSCOPY N/A 04/30/2015   Procedure: COLONOSCOPY;  Surgeon: Daneil Dolin, MD;  [facility];  Service: Endoscopy;  Laterality: N/A;  10:30 Am  . CYSTOSCOPY  05/02/2012   Procedure: CYSTOSCOPY;  Surgeon: Alexis Frock, MD;  Location: [facility];  Service: Urology;  Laterality: N/A;  removal of foreign body    There were no vitals filed for this visit.  Subjective Assessment - 05/31/19 1752    Subjective  Got up at 4 AM this morning and is tired - did not have time to take a nap today. Is going to see a neurologist in [place] next week.    Pertinent History  Bilateral small infarcts, hypertension, hyperlipidemia, tobacco    How long can you walk comfortably?  about 100'    Diagnostic tests  MRI: Bilateral small infarcts - Acute 9 mm infarction at the right lateral thalamus/posterior limb, Acute 5 mm infarction in the left lateral thalamus/posterior limb, acute 4 mm infarction in the left basal ganglia.    Patient Stated Goals  wants to get rid of RW, better walking                        [facility] Adult PT Treatment/Exercise - 05/31/19 0001      Transfers   Transfers  Sit to Stand;Stand to Sit    Sit to Stand  5: Supervision;Without upper extremity assist    Sit to Stand Details (indicate cue type and reason)  From blue mat table performing 1 x 10 reps sit > stand > mini squat with 5 second hold and back to standing with focus on hip ABD. Cues for proper technique. Performed 1 x 10 reps sit <> stands on black foam balance beam and then static holds on compliant surface while standing, focus on eccentric control when lowering back to mat table.       Ambulation/Gait   Ambulation/Gait  Yes    Ambulation/Gait Assistance  5: Supervision;4: Min guard    Ambulation/Gait Assistance Details  Pt able to ambulate without AD and min guard from therapist - when pt is beginning to fatigue, demonstrates more ataxic gait pattern and a narrow BOS. No episodes of LOB. Throughout remainder of session ambulated with RW due to increased LE fatigue. Cues for upright posture throughout gait - pt with tendency to remain looking down at the floor with increased cervical flexion.  Ambulation Distance (Feet)  200 Feet   approx. 60 feet with no AD    Assistive device  Rolling walker;None    Gait Pattern  Decreased step length - right;Decreased stance time - left;Decreased hip/knee flexion - left;Ataxic;Trunk flexed;Decreased stride length;Step-to pattern;Step-through pattern;Decreased arm swing - right;Decreased arm swing - left    Ambulation Surface  Level;Indoor      Neuro Re-ed    Neuro Re-ed Details   In // bars: Lateral step ups on 4" step with LLE 2 x 10 reps (single UE support), RLE 2 x 10 reps (with intermittent UE support on // bars). On rocker board A/P weight shifting 1 x 15 reps progressing to holds in the center with reciprocal UE arm lifts 1 x 10 reps, static standing balance with eyes closed 5 reps with need for intermittent UE support.       Exercises    Exercises  Knee/Hip      Knee/Hip Exercises: Prone   Hamstring Curl  2 sets;10 reps    Hamstring Curl Limitations  1 set without weight, additional set with 2 lb ankle weight, focus on eccentric control when lowering. Pt with decreased with mild ataxic movements when performing.                PT Short Term Goals - 05/19/19 1649      PT SHORT TERM GOAL #1   Title  Patient will be independent with initial HEP in order to build upon functional gains in therapy. ALL STGS DUE 06/16/19    Time  4    Period  Weeks    Status  New    Target Date  06/16/19      PT SHORT TERM GOAL #2   Title  Pt will undergo further assessment of 5x sit to stand and gait speed and write LTGs, as approrpiate.    Time  4    Period  Weeks    Status  New      PT SHORT TERM GOAL #3   Title  Patient will increase BERG score to at least a 35/56 in order to decrease risk of falls.    Baseline  29/56    Time  4    Period  Weeks    Status  New      PT SHORT TERM GOAL #4   Title  Patient will decrease TUG score using LRAD to at least 24 seconds in order to decrease risk of falls.    Baseline  27.09 seconds    Time  4    Period  Weeks    Status  New      PT SHORT TERM GOAL #5   Title  Patient will ambulate at least 200' on level indoor sufaces with LRAD with supervision in order to improve household mobility.    Time  4    Period  Weeks    Status  New        PT Long Term Goals - 05/19/19 1651      PT LONG TERM GOAL #1   Title  Patient will be independent with final HEP in order to build upon functional gains in therapy. ALL LTGS DUE 07/18/19.    Time  8    Period  Weeks    Status  New    Target Date  07/14/19      PT LONG TERM GOAL #2   Title  Patient will increase BERG score to at least a 42/56  in order to decrease risk of falls.    Time  8    Period  Weeks    Status  New      PT LONG TERM GOAL #3   Title  Patient will decrease TUG score using LRAD to at least 20 seconds in order to  decrease risk of falls.    Time  8    Period  Weeks    Status  New      PT LONG TERM GOAL #4   Title  Patient will ambulate at least 300' on onlevel outdoor sufaces with LRAD with supervision in order to improve community mobility.    Time  8    Period  Weeks    Status  New            Plan - 05/31/19 2130    Clinical Impression Statement  Today's skilled session focused on LE strengthening, gait training, and static standing balance. Pt tolerated treatment well. Pt demonstrates lack of eccentric control when performing prone hamstring curls with mild ataxia. Able to walk longer distances today without use of RW. Will continue to progress towards LTGs.    Personal Factors and Comorbidities  Comorbidity 1;Comorbidity 2    Comorbidities  HTN, HLD    Examination-Activity Limitations  Locomotion Level;Stand    Stability/Clinical Decision Making  Evolving/Moderate complexity    Rehab Potential  Good    PT Frequency  2x / week    PT Duration  8 weeks    PT Treatment/Interventions  ADLs/Self Care Home Management;DME Instruction;Gait training;Functional mobility training;Stair training;Neuromuscular re-education;Balance training;Therapeutic exercise;Therapeutic activities;Patient/family education;Energy conservation    PT Next Visit Plan  Gait with no AD at beginning of session before pt fatigues. Balance strategies on compliant surfaces. LE strengthening and weight shifting L>R. standing balance endurance activites.    PT Home Exercise Plan  [id number]    Consulted and Agree with Plan of Care  Patient       Patient will benefit from skilled therapeutic intervention in order to improve the following deficits and impairments:  Abnormal gait, Decreased activity tolerance, Decreased balance, Decreased endurance, Decreased coordination, Difficulty walking, Decreased strength, Postural dysfunction  Visit Diagnosis: Unsteadiness on feet  Ataxic gait  Other symptoms and signs involving the  nervous system  Muscle weakness (generalized)  Other abnormalities of gait and mobility     Problem List Patient Active Problem List   Diagnosis Date Noted  . Stroke (HCC) 05/06/2019  . Hypokalemia 05/06/2019  . Hyponatremia 05/06/2019  . Essential hypertension 05/06/2019  . Acute lower UTI 05/06/2019  . Special screening for malignant neoplasms, colon     Drake Leachhloe N Tyleigh Mahn, PT, DPT 05/31/2019, 9:42 PM  [facility] [address] [place], [place], [address] Phone: [phone]   Fax:  [phone]  Name: Jaquita RectorDaniel R Roadcap MRN: [id number] Date of Birth: 15-Mar-1951

## 2019-06-03 ENCOUNTER — Encounter: Payer: Self-pay | Admitting: Physical Therapy

## 2019-06-03 ENCOUNTER — Other Ambulatory Visit: Payer: Self-pay

## 2019-06-03 ENCOUNTER — Ambulatory Visit: Payer: Medicare HMO | Admitting: Physical Therapy

## 2019-06-03 DIAGNOSIS — M6281 Muscle weakness (generalized): Secondary | ICD-10-CM

## 2019-06-03 DIAGNOSIS — R260 Ataxic gait: Secondary | ICD-10-CM | POA: Diagnosis not present

## 2019-06-03 DIAGNOSIS — R29818 Other symptoms and signs involving the nervous system: Secondary | ICD-10-CM | POA: Diagnosis not present

## 2019-06-03 DIAGNOSIS — R2681 Unsteadiness on feet: Secondary | ICD-10-CM

## 2019-06-03 DIAGNOSIS — R2689 Other abnormalities of gait and mobility: Secondary | ICD-10-CM

## 2019-06-03 NOTE — Therapy (Signed)
[facility] [address] [place], [place], [address] Phone: [phone]   Fax:  [phone]  Physical Therapy Treatment  Patient Details  Name: Steven Hahn MRN: [id number] Date of Birth: September 18, 1951 Referring Provider (PT): Kari Baars, MD   Encounter Date: 06/03/2019  PT End of Session - 06/03/19 0805    Visit Number  5    Number of Visits  17    Date for PT Re-Evaluation  07/18/19    Authorization Type  [employer] Medicare, VL: follow Medicare guidelines    PT Start Time  0802    PT Stop Time  0841    PT Time Calculation (min)  39 min    Equipment Utilized During Treatment  Gait belt    Activity Tolerance  Patient tolerated treatment well    Behavior During Therapy  [facility] for tasks assessed/performed       Past Medical History:  Diagnosis Date  . Hypertension     Past Surgical History:  Procedure Laterality Date  . BACK SURGERY    . COLONOSCOPY N/A 04/30/2015   Procedure: COLONOSCOPY;  Surgeon: Corbin Ade, MD;  [facility];  Service: Endoscopy;  Laterality: N/A;  10:30 Am  . CYSTOSCOPY  05/02/2012   Procedure: CYSTOSCOPY;  Surgeon: Sebastian Ache, MD;  Location: [facility];  Service: Urology;  Laterality: N/A;  removal of foreign body    There were no vitals filed for this visit.  Subjective Assessment - 06/03/19 0804    Subjective  No new complaints. Was tired after last session. No falls or pain to report.    Pertinent History  Bilateral small infarcts, hypertension, hyperlipidemia, tobacco    How long can you walk comfortably?  about 100'    Diagnostic tests  MRI: Bilateral small infarcts - Acute 9 mm infarction at the right lateral thalamus/posterior limb, Acute 5 mm infarction in the left lateral thalamus/posterior limb, acute 4 mm infarction in the left basal ganglia.    Patient Stated Goals  wants to get rid of RW, better walking    Currently in Pain?  No/denies    Pain Score  0-No pain            [facility] Adult PT Treatment/Exercise - 06/03/19 0806      Transfers   Transfers  Sit to Stand;Stand to Sit    Sit to Stand  5: Supervision;With upper extremity assist;From bed;From chair/3-in-1    Stand to Sit  5: Supervision;With upper extremity assist;To bed;To chair/3-in-1      Ambulation/Gait   Ambulation/Gait  Yes    Ambulation/Gait Assistance  5: Supervision;4: Min guard    Ambulation/Gait Assistance Details  into/out of gym with RW. no device used in session with min guard assist to min assist. increased assist needed toward end of session. only short distances walked between activities.     Assistive device  Rolling walker;None    Gait Pattern  Decreased step length - right;Decreased stance time - left;Decreased hip/knee flexion - left;Ataxic;Trunk flexed;Decreased stride length;Step-to pattern;Step-through pattern;Decreased arm swing - right;Decreased arm swing - left    Ambulation Surface  Level;Indoor      Knee/Hip Exercises: Aerobic   Other Aerobic  Scifit level 2.0 for 8 minutes with goal >/= 50 rpm for strengthening and activity tolerance.       Knee/Hip Exercises: Machines for Strengthening   Cybex Leg Press  bil LE's-80#'s for 2 sets of 10 reps with cues for form, assist to prevent  recurvatum with knees; single leg- 60#'s for 10 reps each leg with cues for form, and assist at knees.           Balance Exercises - 06/03/19 0820      Balance Exercises: Standing   Standing Eyes Closed  Narrow base of support (BOS);Wide (BOA);Head turns;Foam/compliant surface;Other reps (comment);30 secs;Limitations    Tandem Stance  Eyes closed;Foam/compliant surface;2 reps;30 secs;Limitations    Tandem Gait  Forward;Retro;Upper extremity support;Foam/compliant surface;4 reps;Limitations    Sidestepping  Foam/compliant support;4 reps;Limitations    Other Standing Exercises  seated with feet on blue airex: sit<>stands for 10 reps with no UE support, min guard assist       Balance Exercises: Standing   Standing Eyes Closed Limitations  on airex with no UE support, min guard to min assist. narrow base EC no head movements, wide base with EC for head movements left<>right, up<>down and diagonals both ways.      Tandem Stance Limitations  on airex for 2 reps each foot forward, intermittent touch to chair for balance. min guard to min assist.     Tandem Gait Limitations  on blue foam beam with light UE support, cues on step placement and form    Sidestepping Limitations  on blue foam beam with light UE support, cues on step length and posture.           PT Short Term Goals - 05/19/19 1649      PT SHORT TERM GOAL #1   Title  Patient will be independent with initial HEP in order to build upon functional gains in therapy. ALL STGS DUE 06/16/19    Time  4    Period  Weeks    Status  New    Target Date  06/16/19      PT SHORT TERM GOAL #2   Title  Pt will undergo further assessment of 5x sit to stand and gait speed and write LTGs, as approrpiate.    Time  4    Period  Weeks    Status  New      PT SHORT TERM GOAL #3   Title  Patient will increase BERG score to at least a 35/56 in order to decrease risk of falls.    Baseline  29/56    Time  4    Period  Weeks    Status  New      PT SHORT TERM GOAL #4   Title  Patient will decrease TUG score using LRAD to at least 24 seconds in order to decrease risk of falls.    Baseline  27.09 seconds    Time  4    Period  Weeks    Status  New      PT SHORT TERM GOAL #5   Title  Patient will ambulate at least 200' on level indoor sufaces with LRAD with supervision in order to improve household mobility.    Time  4    Period  Weeks    Status  New        PT Long Term Goals - 05/19/19 1651      PT LONG TERM GOAL #1   Title  Patient will be independent with final HEP in order to build upon functional gains in therapy. ALL LTGS DUE 07/18/19.    Time  8    Period  Weeks    Status  New    Target Date  07/14/19       PT  LONG TERM GOAL #2   Title  Patient will increase BERG score to at least a 42/56 in order to decrease risk of falls.    Time  8    Period  Weeks    Status  New      PT LONG TERM GOAL #3   Title  Patient will decrease TUG score using LRAD to at least 20 seconds in order to decrease risk of falls.    Time  8    Period  Weeks    Status  New      PT LONG TERM GOAL #4   Title  Patient will ambulate at least 300' on onlevel outdoor sufaces with LRAD with supervision in order to improve community mobility.    Time  8    Period  Weeks    Status  New            Plan - 06/03/19 0805    Clinical Impression Statement  Today's skilled session continued to work on gait with no AD, LE strengthening and balance reactions. Rest breaks taken throughout due to fatigue. No other issues reported. The pt is making steady progress toward goals and should benefit from continued PT to progress toward unmet goals.    Personal Factors and Comorbidities  Comorbidity 1;Comorbidity 2    Comorbidities  HTN, HLD    Examination-Activity Limitations  Locomotion Level;Stand    Stability/Clinical Decision Making  Evolving/Moderate complexity    Rehab Potential  Good    PT Frequency  2x / week    PT Duration  8 weeks    PT Treatment/Interventions  ADLs/Self Care Home Management;DME Instruction;Gait training;Functional mobility training;Stair training;Neuromuscular re-education;Balance training;Therapeutic exercise;Therapeutic activities;Patient/family education;Energy conservation    PT Next Visit Plan  Gait with no AD at beginning of session before pt fatigues. Balance strategies on compliant surfaces. LE strengthening and weight shifting L>R. standing balance endurance activites.    PT Home Exercise Plan  [id number]    Consulted and Agree with Plan of Care  Patient       Patient will benefit from skilled therapeutic intervention in order to improve the following deficits and impairments:  Abnormal gait,  Decreased activity tolerance, Decreased balance, Decreased endurance, Decreased coordination, Difficulty walking, Decreased strength, Postural dysfunction  Visit Diagnosis: Unsteadiness on feet  Ataxic gait  Other symptoms and signs involving the nervous system  Muscle weakness (generalized)  Other abnormalities of gait and mobility     Problem List Patient Active Problem List   Diagnosis Date Noted  . Stroke (HCC) 05/06/2019  . Hypokalemia 05/06/2019  . Hyponatremia 05/06/2019  . Essential hypertension 05/06/2019  . Acute lower UTI 05/06/2019  . Special screening for malignant neoplasms, colon     Sallyanne KusterKathy , PTA, [facility] [address], [address] [place], [place] [address] [phone] 06/03/19, 3:19 PM   Name: Jaquita RectorDaniel R Macchia MRN: [id number] Date of Birth: 1951/03/25

## 2019-06-06 DIAGNOSIS — E7849 Other hyperlipidemia: Secondary | ICD-10-CM | POA: Diagnosis not present

## 2019-06-06 DIAGNOSIS — I69398 Other sequelae of cerebral infarction: Secondary | ICD-10-CM | POA: Diagnosis not present

## 2019-06-06 DIAGNOSIS — I69393 Ataxia following cerebral infarction: Secondary | ICD-10-CM | POA: Diagnosis not present

## 2019-06-06 DIAGNOSIS — I10 Essential (primary) hypertension: Secondary | ICD-10-CM | POA: Diagnosis not present

## 2019-06-07 ENCOUNTER — Ambulatory Visit: Payer: Medicare HMO | Admitting: Physical Therapy

## 2019-06-07 ENCOUNTER — Other Ambulatory Visit: Payer: Self-pay

## 2019-06-07 VITALS — BP 163/91 | HR 65

## 2019-06-07 DIAGNOSIS — R29818 Other symptoms and signs involving the nervous system: Secondary | ICD-10-CM

## 2019-06-07 DIAGNOSIS — M6281 Muscle weakness (generalized): Secondary | ICD-10-CM

## 2019-06-07 DIAGNOSIS — R260 Ataxic gait: Secondary | ICD-10-CM | POA: Diagnosis not present

## 2019-06-07 DIAGNOSIS — R2681 Unsteadiness on feet: Secondary | ICD-10-CM | POA: Diagnosis not present

## 2019-06-07 DIAGNOSIS — R2689 Other abnormalities of gait and mobility: Secondary | ICD-10-CM | POA: Diagnosis not present

## 2019-06-08 ENCOUNTER — Telehealth: Payer: Self-pay | Admitting: Physical Therapy

## 2019-06-08 NOTE — Telephone Encounter (Signed)
Dr. Luan Pulling,  Steven Hahn has been seen by Physical Therapy at [facility]. The patient would benefit from an OT evaluation due to impaired LUE strength and coordination required for ADLs. If you agree, please place an order in [place] in Epic or fax the order to [phone].  Thank you, Janann August, PT, DPT 06/08/19 9:14 AM

## 2019-06-08 NOTE — Therapy (Signed)
[facility] [address] [place], [place], [address] Phone: [phone]   Fax:  [phone]  Physical Therapy Treatment  Patient Details  Name: Steven Hahn MRN: [id number] Date of Birth: May 07, 1951 Referring Provider (PT): Kari Baars, MD   Encounter Date: 06/07/2019  PT End of Session - 06/08/19 0909    Visit Number  6    Number of Visits  17    Date for PT Re-Evaluation  07/18/19    Authorization Type  A[employer]na Medicare, VL: follow Medicare guidelines    PT Start Time  1739    PT Stop Time  1820    PT Time Calculation (min)  41 min    Equipment Utilized During Treatment  Gait belt    Activity Tolerance  Patient tolerated treatment well    Behavior During Therapy  [facility] for tasks assessed/performed       Past Medical History:  Diagnosis Date  . Hypertension     Past Surgical History:  Procedure Laterality Date  . BACK SURGERY    . COLONOSCOPY N/A 04/30/2015   Procedure: COLONOSCOPY;  Surgeon: Corbin Ade, MD;  [facility];  Service: Endoscopy;  Laterality: N/A;  10:30 Am  . CYSTOSCOPY  05/02/2012   Procedure: CYSTOSCOPY;  Surgeon: Sebastian Ache, MD;  Location: [facility];  Service: Urology;  Laterality: N/A;  removal of foreign body    Vitals:   06/07/19 1744  BP: (!) 163/91  Pulse: 65    Subjective Assessment - 06/07/19 1739    Subjective  States that he had an appointment with the neurologist the other day, pt forgot to ask about clearance to drive. Notices his HR has been between 53-68 bpm. No falls. Exercises have been going alright.    Pertinent History  Bilateral small infarcts, hypertension, hyperlipidemia, tobacco    How long can you walk comfortably?  about 100'    Diagnostic tests  MRI: Bilateral small infarcts - Acute 9 mm infarction at the right lateral thalamus/posterior limb, Acute 5 mm infarction in the left lateral thalamus/posterior limb, acute 4 mm infarction in the left  basal ganglia.    Patient Stated Goals  wants to get rid of RW, better walking                       [facility] Adult PT Treatment/Exercise - 06/08/19 0920      Ambulation/Gait   Ambulation/Gait  Yes    Ambulation/Gait Assistance  5: Supervision;4: Min guard    Ambulation/Gait Assistance Details  Ambulated at beginning of session without device, cues to slow down gait speed, when pt increases gait speed, pt has tendency to demonstrate a scissoring/narrow BOS gait pattern, requiring min guard. At end of session pt's LE were fatigued - performed remainder of walking around therapy gym with RW.    Ambulation Distance (Feet)  150 Feet    Assistive device  None    Gait Pattern  Decreased step length - right;Decreased stance time - left;Decreased hip/knee flexion - left;Ataxic;Trunk flexed;Decreased stride length;Step-through pattern;Decreased arm swing - right;Decreased arm swing - left;Narrow base of support;Scissoring    Ambulation Surface  Level;Indoor      High Level Balance   High Level Balance Comments  In // bars with no UE support: Stepping over black foam on lowest height with RLE with focus on heel strike and weight shifting anteriorly and taking a big step posteriorly 1 x 10 reps, performed the same  activity on LLE for gait mechanics 2 x 10 reps without stepping over black foam - colorful balance disc placed anteriorly for visual cueing on where to step. Staggered LE anterior/posterior weight shifting B with lifting reciprocal UE and focus on upright stance. On foam performed weight shifting with wide BOS laterally with unilateral UE reaching superiorly and laterally -2 x 10 reps. SLS taps on single colorful bubble on floor, 1 x 10 reps on LLE. On blue foam beam with wide BOS: mini squats 3 x 5 reps, holding a mini squat side stepping with LLE 1 x 5 reps. Standing and seated rest breaks taken throughout session.       Self-Care   Self-Care  Other Self-Care Comments    Other  Self-Care Comments   Discussed with pt risk factors of a CVA and smoking cessation - pt verbalized understanding and importance of trying to stop smoking.              PT Education - 06/08/19 0909    Education Details  risk factors for CVA, smoking cessation    Person(s) Educated  Patient    Methods  Explanation    Comprehension  Verbalized understanding;Need further instruction       PT Short Term Goals - 05/19/19 1649      PT SHORT TERM GOAL #1   Title  Patient will be independent with initial HEP in order to build upon functional gains in therapy. ALL STGS DUE 06/16/19    Time  4    Period  Weeks    Status  New    Target Date  06/16/19      PT SHORT TERM GOAL #2   Title  Pt will undergo further assessment of 5x sit to stand and gait speed and write LTGs, as approrpiate.    Time  4    Period  Weeks    Status  New      PT SHORT TERM GOAL #3   Title  Patient will increase BERG score to at least a 35/56 in order to decrease risk of falls.    Baseline  29/56    Time  4    Period  Weeks    Status  New      PT SHORT TERM GOAL #4   Title  Patient will decrease TUG score using LRAD to at least 24 seconds in order to decrease risk of falls.    Baseline  27.09 seconds    Time  4    Period  Weeks    Status  New      PT SHORT TERM GOAL #5   Title  Patient will ambulate at least 200' on level indoor sufaces with LRAD with supervision in order to improve household mobility.    Time  4    Period  Weeks    Status  New        PT Long Term Goals - 05/19/19 1651      PT LONG TERM GOAL #1   Title  Patient will be independent with final HEP in order to build upon functional gains in therapy. ALL LTGS DUE 07/18/19.    Time  8    Period  Weeks    Status  New    Target Date  07/14/19      PT LONG TERM GOAL #2   Title  Patient will increase BERG score to at least a 42/56 in order to decrease risk of falls.  Time  8    Period  Weeks    Status  New      PT LONG TERM  GOAL #3   Title  Patient will decrease TUG score using LRAD to at least 20 seconds in order to decrease risk of falls.    Time  8    Period  Weeks    Status  New      PT LONG TERM GOAL #4   Title  Patient will ambulate at least 300' on onlevel outdoor sufaces with LRAD with supervision in order to improve community mobility.    Time  8    Period  Weeks    Status  New            Plan - 06/08/19 0910    Clinical Impression Statement  Today's skilled session focused on gait training, and neuro re-ed for gait mechanics and LLE strength. Performed gait at beginning of session with no AD - when pt ambulates quickly pt demonstrates a narrow BOS and a scissoring gait pattern, requiring min guard. Pt advised to continue to use RW while ambulating at home. Pt with improved ability today to weight shift towards LLE. Pt is progresing well - and will continue to progress towards LTGs.    Personal Factors and Comorbidities  Comorbidity 1;Comorbidity 2    Comorbidities  HTN, HLD    Examination-Activity Limitations  Locomotion Level;Stand    Stability/Clinical Decision Making  Evolving/Moderate complexity    Rehab Potential  Good    PT Frequency  2x / week    PT Duration  8 weeks    PT Treatment/Interventions  ADLs/Self Care Home Management;DME Instruction;Gait training;Functional mobility training;Stair training;Neuromuscular re-education;Balance training;Therapeutic exercise;Therapeutic activities;Patient/family education;Energy conservation    PT Next Visit Plan  Gait with no AD at beginning of session before pt fatigues. Balance strategies on compliant surfaces. LE strengthening and weight shifting L>R. standing balance endurance activites.    PT Home Exercise Plan  [id number]    Consulted and Agree with Plan of Care  Patient       Patient will benefit from skilled therapeutic intervention in order to improve the following deficits and impairments:  Abnormal gait, Decreased activity tolerance,  Decreased balance, Decreased endurance, Decreased coordination, Difficulty walking, Decreased strength, Postural dysfunction  Visit Diagnosis: Unsteadiness on feet  Ataxic gait  Other symptoms and signs involving the nervous system  Muscle weakness (generalized)  Other abnormalities of gait and mobility     Problem List Patient Active Problem List   Diagnosis Date Noted  . Stroke (HCC) 05/06/2019  . Hypokalemia 05/06/2019  . Hyponatremia 05/06/2019  . Essential hypertension 05/06/2019  . Acute lower UTI 05/06/2019  . Special screening for malignant neoplasms, colon     Drake Leachhloe N Phil Corti, PT, DPT 06/08/2019, 9:26 AM  [facility] [address] [place], [place], [address] Phone: [phone]   Fax:  [phone]  Name: Steven Hahn MRN: [id number] Date of Birth: 01/16/1951

## 2019-06-09 ENCOUNTER — Other Ambulatory Visit: Payer: Self-pay

## 2019-06-09 ENCOUNTER — Encounter: Payer: Self-pay | Admitting: Physical Therapy

## 2019-06-09 ENCOUNTER — Ambulatory Visit: Payer: Medicare HMO | Admitting: Physical Therapy

## 2019-06-09 DIAGNOSIS — R2681 Unsteadiness on feet: Secondary | ICD-10-CM | POA: Diagnosis not present

## 2019-06-09 DIAGNOSIS — R29818 Other symptoms and signs involving the nervous system: Secondary | ICD-10-CM | POA: Diagnosis not present

## 2019-06-09 DIAGNOSIS — M6281 Muscle weakness (generalized): Secondary | ICD-10-CM | POA: Diagnosis not present

## 2019-06-09 DIAGNOSIS — R2689 Other abnormalities of gait and mobility: Secondary | ICD-10-CM

## 2019-06-09 DIAGNOSIS — R260 Ataxic gait: Secondary | ICD-10-CM

## 2019-06-10 NOTE — Therapy (Signed)
[facility] [address] [place], [place], [address] Phone: [phone]   Fax:  [phone]  Physical Therapy Treatment  Patient Details  Name: Steven Hahn MRN: [id number] Date of Birth: [birth year] Referring Provider (PT): Sinda Du, MD   Encounter Date: 06/09/2019  PT End of Session - 06/09/19 0936    Visit Number  7    Number of Visits  17    Date for PT Re-Evaluation  07/18/19    Authorization Type  [employer] Medicare, VL: follow Medicare guidelines    PT Start Time  0933    PT Stop Time  1012    PT Time Calculation (min)  39 min    Equipment Utilized During Treatment  Gait belt    Activity Tolerance  Patient tolerated treatment well    Behavior During Therapy  [facility] for tasks assessed/performed       Past Medical History:  Diagnosis Date  . Hypertension     Past Surgical History:  Procedure Laterality Date  . BACK SURGERY    . COLONOSCOPY N/A 04/30/2015   Procedure: COLONOSCOPY;  Surgeon: Daneil Dolin, MD;  [facility];  Service: Endoscopy;  Laterality: N/A;  10:30 Am  . CYSTOSCOPY  05/02/2012   Procedure: CYSTOSCOPY;  Surgeon: Alexis Frock, MD;  Location: [facility];  Service: Urology;  Laterality: N/A;  removal of foreign body    There were no vitals filed for this visit.  Subjective Assessment - 06/09/19 0935    Subjective  Woke up with "a tripy headache". Did not take his BP. Does report it's gone now. No pain currently or falls to report. Does report he is tired today, "did more than I should have yesterday".    Pertinent History  Bilateral small infarcts, hypertension, hyperlipidemia, tobacco    Diagnostic tests  MRI: Bilateral small infarcts - Acute 9 mm infarction at the right lateral thalamus/posterior limb, Acute 5 mm infarction in the left lateral thalamus/posterior limb, acute 4 mm infarction in the left basal ganglia.    Patient Stated Goals  wants to get rid of RW, better  walking    Currently in Pain?  No/denies    Pain Score  0-No pain           [facility] Adult PT Treatment/Exercise - 06/09/19 0937      Transfers   Transfers  Sit to Stand;Stand to Sit    Sit to Stand  5: Supervision;With upper extremity assist;From bed;From chair/3-in-1    Stand to Sit  5: Supervision;With upper extremity assist;To bed;To chair/3-in-1      Ambulation/Gait   Ambulation/Gait  Yes    Ambulation/Gait Assistance  4: Min guard;4: Min assist    Ambulation/Gait Assistance Details  1st lap with no AD with min guard assist upgrading to min assist with fatigue. one episode of foot scuffing needing assist to correct.     Ambulation Distance (Feet)  115 Feet   x1 no AD, plus around gym with activities   Assistive device  None    Gait Pattern  Decreased step length - right;Decreased stance time - left;Decreased hip/knee flexion - left;Ataxic;Trunk flexed;Decreased stride length;Step-through pattern;Decreased arm swing - right;Decreased arm swing - left;Narrow base of support;Scissoring    Ambulation Surface  Level;Indoor          Balance Exercises - 06/09/19 0958      Balance Exercises: Standing   Standing Eyes Closed  Wide (BOA);Narrow base of support (BOS);Head turns;Foam/compliant surface;Other reps (  comment);30 secs;Limitations    Balance Beam  standing with feet across red foam beam: alternating fwd stepping to floor/back onto beam, then alternating bwd stepping to floor/back onto beam for 10 reps each/each side with cues on step length/step height and weight shifting. min guard to min assist with no UE support/occasional touch to bars for balance.     Other Standing Exercises  seated with feet across red foam beam: sit<>stands for 10 reps with no UE support, min guard assist      Balance Exercises: Standing   Standing Eyes Closed Limitations  on airex with no UE support: feet apart progressing to feet together for EC no head movements, back to feet apart for EC head  movements left<>right, up<>down and diagonals both ways for 10 reps each. min guard to min assist for balance.           PT Short Term Goals - 05/19/19 1649      PT SHORT TERM GOAL #1   Title  Patient will be independent with initial HEP in order to build upon functional gains in therapy. ALL STGS DUE 06/16/19    Time  4    Period  Weeks    Status  New    Target Date  06/16/19      PT SHORT TERM GOAL #2   Title  Pt will undergo further assessment of 5x sit to stand and gait speed and write LTGs, as approrpiate.    Time  4    Period  Weeks    Status  New      PT SHORT TERM GOAL #3   Title  Patient will increase BERG score to at least a 35/56 in order to decrease risk of falls.    Baseline  29/56    Time  4    Period  Weeks    Status  New      PT SHORT TERM GOAL #4   Title  Patient will decrease TUG score using LRAD to at least 24 seconds in order to decrease risk of falls.    Baseline  27.09 seconds    Time  4    Period  Weeks    Status  New      PT SHORT TERM GOAL #5   Title  Patient will ambulate at least 200' on level indoor sufaces with LRAD with supervision in order to improve household mobility.    Time  4    Period  Weeks    Status  New        PT Long Term Goals - 05/19/19 1651      PT LONG TERM GOAL #1   Title  Patient will be independent with final HEP in order to build upon functional gains in therapy. ALL LTGS DUE 07/18/19.    Time  8    Period  Weeks    Status  New    Target Date  07/14/19      PT LONG TERM GOAL #2   Title  Patient will increase BERG score to at least a 42/56 in order to decrease risk of falls.    Time  8    Period  Weeks    Status  New      PT LONG TERM GOAL #3   Title  Patient will decrease TUG score using LRAD to at least 20 seconds in order to decrease risk of falls.    Time  8    Period  Weeks  Status  New      PT LONG TERM GOAL #4   Title  Patient will ambulate at least 300' on onlevel outdoor sufaces with LRAD with  supervision in order to improve community mobility.    Time  8    Period  Weeks    Status  New            Plan - 06/09/19 9528    Clinical Impression Statement  Today's skilled session continued to focus on gait with no AD and balance reactions. Pt moslty needing min guard assist, up to min assist as he fatigues. The pt is progressing toward goals and should benefit from continued PT to progress toward unmet goals.    Personal Factors and Comorbidities  Comorbidity 1;Comorbidity 2    Comorbidities  HTN, HLD    Examination-Activity Limitations  Locomotion Level;Stand    Stability/Clinical Decision Making  Evolving/Moderate complexity    Rehab Potential  Good    PT Frequency  2x / week    PT Duration  8 weeks    PT Treatment/Interventions  ADLs/Self Care Home Management;DME Instruction;Gait training;Functional mobility training;Stair training;Neuromuscular re-education;Balance training;Therapeutic exercise;Therapeutic activities;Patient/family education;Energy conservation    PT Next Visit Plan  STGs due 06/16/19, continue to work on gait without AD when not fatigued, LE strengthening and balance reactions on compliant surfaces.    PT Home Exercise Plan  [email or web address]    Consulted and Agree with Plan of Care  Patient       Patient will benefit from skilled therapeutic intervention in order to improve the following deficits and impairments:  Abnormal gait, Decreased activity tolerance, Decreased balance, Decreased endurance, Decreased coordination, Difficulty walking, Decreased strength, Postural dysfunction  Visit Diagnosis: Unsteadiness on feet  Ataxic gait  Other symptoms and signs involving the nervous system  Muscle weakness (generalized)  Other abnormalities of gait and mobility     Problem List Patient Active Problem List   Diagnosis Date Noted  . Stroke (HCC) 05/06/2019  . Hypokalemia 05/06/2019  . Hyponatremia 05/06/2019  . Essential hypertension 05/06/2019  . Acute  lower UTI 05/06/2019  . Special screening for malignant neoplasms, colon     Sallyanne Kuster, PTA, [facility] [address], [address] [place], [place] [address] [phone] 06/10/19, 2:38 PM   Name: Steven Hahn MRN: [id number] Date of Birth: 1951/04/15

## 2019-06-14 ENCOUNTER — Ambulatory Visit: Payer: Medicare HMO | Admitting: Physical Therapy

## 2019-06-14 ENCOUNTER — Other Ambulatory Visit: Payer: Self-pay

## 2019-06-14 ENCOUNTER — Encounter: Payer: Self-pay | Admitting: Physical Therapy

## 2019-06-14 DIAGNOSIS — R2689 Other abnormalities of gait and mobility: Secondary | ICD-10-CM

## 2019-06-14 DIAGNOSIS — R29818 Other symptoms and signs involving the nervous system: Secondary | ICD-10-CM | POA: Diagnosis not present

## 2019-06-14 DIAGNOSIS — R2681 Unsteadiness on feet: Secondary | ICD-10-CM

## 2019-06-14 DIAGNOSIS — M6281 Muscle weakness (generalized): Secondary | ICD-10-CM

## 2019-06-14 DIAGNOSIS — R260 Ataxic gait: Secondary | ICD-10-CM | POA: Diagnosis not present

## 2019-06-14 NOTE — Therapy (Signed)
[facility] [address] [place], [place], [address] Phone: [phone]   Fax:  [phone]  Physical Therapy Treatment  Patient Details  Name: Steven Hahn MRN: [id number] Date of Birth: June 08, 1951 Referring Provider (PT): Sinda Du, MD   Encounter Date: 06/14/2019  PT End of Session - 06/14/19 2143    Visit Number  8    Number of Visits  17    Date for PT Re-Evaluation  07/18/19    Authorization Type  [employer]na Medicare, VL: follow Medicare guidelines    PT Start Time  1532    PT Stop Time  1615    PT Time Calculation (min)  43 min    Equipment Utilized During Treatment  Gait belt    Activity Tolerance  Patient tolerated treatment well    Behavior During Therapy  [facility] for tasks assessed/performed       Past Medical History:  Diagnosis Date  . Hypertension     Past Surgical History:  Procedure Laterality Date  . BACK SURGERY    . COLONOSCOPY N/A 04/30/2015   Procedure: COLONOSCOPY;  Surgeon: Daneil Dolin, MD;  [facility];  Service: Endoscopy;  Laterality: N/A;  10:30 Am  . CYSTOSCOPY  05/02/2012   Procedure: CYSTOSCOPY;  Surgeon: Alexis Frock, MD;  [facility];  Service: Urology;  Laterality: N/A;  removal of foreign body    There were no vitals filed for this visit.  Subjective Assessment - 06/14/19 1536    Subjective  Tired after last session. No falls. Exercises have been going alright.    Pertinent History  Bilateral small infarcts, hypertension, hyperlipidemia, tobacco    Diagnostic tests  MRI: Bilateral small infarcts - Acute 9 mm infarction at the right lateral thalamus/posterior limb, Acute 5 mm infarction in the left lateral thalamus/posterior limb, acute 4 mm infarction in the left basal ganglia.    Patient Stated Goals  wants to get rid of RW, better walking         [facility] PT Assessment - 06/14/19 1546      Transfers   Five time sit to stand comments   14.12 seconds    no UE support from blue mat table     Berg Balance Test   Sit to Stand  Able to stand without using hands and stabilize independently    Standing Unsupported  Able to stand safely 2 minutes    Sitting with Back Unsupported but Feet Supported on Floor or Stool  Able to sit safely and securely 2 minutes    Stand to Sit  Sits safely with minimal use of hands    Transfers  Able to transfer safely, minor use of hands    Standing Unsupported with Eyes Closed  Able to stand 10 seconds safely    Standing Unsupported with Feet Together  Able to place feet together independently and stand 1 minute safely    From Standing, Reach Forward with Outstretched Arm  Can reach confidently >25 cm (10")    From Standing Position, Pick up Object from Floor  Able to pick up shoe safely and easily    From Standing Position, Turn to Look Behind Over each Shoulder  Looks behind from both sides and weight shifts well    Turn 360 Degrees  Able to turn 360 degrees safely in 4 seconds or less    Standing Unsupported, Alternately Place Feet on Step/Stool  Able to stand independently and complete 8 steps >20  seconds    Standing Unsupported, One Foot in Front  Able to plae foot ahead of the other independently and hold 30 seconds    Standing on One Leg  Able to lift leg independently and hold equal to or more than 3 seconds   on RLE   Total Score  52    Berg comment:  52/56      Timed Up and Go Test   Normal TUG (seconds)  12.3   no AD                  [facility] Adult PT Treatment/Exercise - 06/14/19 1546      Ambulation/Gait   Ambulation/Gait  Yes    Ambulation/Gait Assistance  4: Min guard;4: Min assist    Ambulation/Gait Assistance Details  min guard with no AD - one episode where pt was turning and conversing with therapist and demonstrated scissoring with almost a LOB - needed min A from therapist to regain balance. Cues for reciprocal arm swing.      Ambulation Distance (Feet)  230 Feet    Assistive  device  None    Gait Pattern  Decreased step length - right;Decreased stance time - left;Decreased hip/knee flexion - left;Ataxic;Trunk flexed;Decreased stride length;Step-through pattern;Decreased arm swing - right;Decreased arm swing - left;Narrow base of support;Scissoring    Ambulation Surface  Level;Indoor          Access Code: [email or web address]  URL: [email or web address]  Date: 06/14/2019  Prepared by: Janann August   New additions as well as upgrades to HEP:   Exercises Proper Sit to Stand Technique - 10 reps - 3 sets - 2x daily - 5x weekly - with LLE staggered behind RLE Clamshell with Resistance - 10 reps - 2 sets - 1x daily - 7x weekly Stride Stance Weight Shift - 10 reps - 2 sets - 2x daily - 7x weekly Marching Bridge - 5 reps - 2 sets - 2x daily - 7x weekly - green theraband  Side Stepping with Resistance at Thighs - 10 reps - 1 sets - 2x daily - 7x weekly - green theraband      PT Education - 06/14/19 2143    Education Details  progress towards goals, new additions to HEP    Person(s) Educated  Patient    Methods  Explanation;Demonstration;Handout    Comprehension  Verbalized understanding;Returned demonstration       PT Short Term Goals - 06/14/19 1543      PT SHORT TERM GOAL #1   Title  Patient will be independent with initial HEP in order to build upon functional gains in therapy. ALL STGS DUE 06/16/19    Time  4    Period  Weeks    Status  Achieved    Target Date  06/16/19      PT SHORT TERM GOAL #2   Title  Pt will undergo further assessment of 5x sit to stand and gait speed and write LTGs, as approrpiate.    Time  4    Period  Weeks    Status  Achieved      PT SHORT TERM GOAL #3   Title  Patient will increase BERG score to at least a 35/56 in order to decrease risk of falls.    Baseline  52/56 on 06/14/19    Time  4    Period  Weeks    Status  Achieved      PT SHORT TERM GOAL #4   Title  Patient will decrease TUG score using LRAD to at  least 24 seconds in order to decrease risk of falls.    Baseline  12.3 seconds with no AD on 06/14/19    Time  4    Period  Weeks    Status  Achieved      PT SHORT TERM GOAL #5   Title  Patient will ambulate at least 200' on level indoor sufaces with LRAD with supervision in order to improve household mobility.    Baseline  230' with no AD, min guard and brief episode of min A when he demonstrated scissoring.    Time  4    Period  Weeks    Status  Achieved        PT Long Term Goals - 05/19/19 1651      PT LONG TERM GOAL #1   Title  Patient will be independent with final HEP in order to build upon functional gains in therapy. ALL LTGS DUE 07/18/19.    Time  8    Period  Weeks    Status  New    Target Date  07/14/19      PT LONG TERM GOAL #2   Title  Patient will increase BERG score to at least a 42/56 in order to decrease risk of falls.    Time  8    Period  Weeks    Status  Achieved     PT LONG TERM GOAL #3   Title  Patient will decrease TUG score using LRAD to at least 20 seconds in order to decrease risk of falls.    Time  8    Period  Weeks    Status  Achieved     PT LONG TERM GOAL #4   Title  Patient will ambulate at least 300' on onlevel outdoor sufaces with LRAD with supervision in order to improve community mobility.    Time  8    Period  Weeks    Status  New         PT Long Term Goals - 06/16/19 1707      PT LONG TERM GOAL #1   Title  Patient will be independent with final HEP in order to build upon functional gains in therapy. ALL LTGS DUE 07/18/19.    Time  8    Period  Weeks    Status  New      PT LONG TERM GOAL #2   Title  Patient will increase BERG score to at least a 55/56 in order to decrease risk of falls.    Baseline  52/56 on 06/14/19    Time  8    Period  Weeks    Status  New      PT LONG TERM GOAL #3   Title  Patient will ambulate at least 200' with supervision with no AD on level surfaces while scanning environment with no LOB.    Time   8    Period  Weeks    Status  New      PT LONG TERM GOAL #4   Title  Patient will ambulate at least 300' on on ulevel outdoor sufaces with LRAD vs. no AD with supervision in order to improve community mobility.    Time  8    Period  Weeks    Status  New      PT LONG TERM GOAL #5   Title  Patient will perform a curb, ramps, and  4 steps with supervision in order to improve community mobility.    Status  New           06/14/19 2145  Plan  Clinical Impression Statement Focus of today's session was assessing pt's STGs - pt is making significant progress with PT. Pt has met all of his STGs and 2 of his LTGs. Revised and added new LTGs as appropriate. Pt's BERG score improved from a 29/56 to a 52/56 - went from a high risk of falls to a lower fall risk. Pt ambulated 200' feet today without AD and min guard, with episodes of scissoring when performing turns. At evaluation, pt struggled to walk 40' with RW with feeling fatigued. Remainder of today's session focused on upgrading LE strengthening exercises to HEP. Will continue to progress towards LTGs.  Personal Factors and Comorbidities Comorbidity 1;Comorbidity 2  Comorbidities HTN, HLD  Examination-Activity Limitations Locomotion Level;Stand  Pt will benefit from skilled therapeutic intervention in order to improve on the following deficits Abnormal gait;Decreased activity tolerance;Decreased balance;Decreased endurance;Decreased coordination;Difficulty walking;Decreased strength;Postural dysfunction  Stability/Clinical Decision Making Evolving/Moderate complexity  Rehab Potential Good  PT Frequency 2x / week  PT Duration 8 weeks  PT Treatment/Interventions ADLs/Self Care Home Management;DME Instruction;Gait training;Functional mobility training;Stair training;Neuromuscular re-education;Balance training;Therapeutic exercise;Therapeutic activities;Patient/family education;Energy conservation  PT Next Visit Plan balance additions to HEP. tall  kneeling. maybe quadraped. work on gait without AD when not fatigued, LE strengthening and balance reactions on compliant surfaces.  PT Home Exercise Plan [email or web address]  Consulted and Agree with Plan of Care Patient      Patient will benefit from skilled therapeutic intervention in order to improve the following deficits and impairments:  Abnormal gait, Decreased activity tolerance, Decreased balance, Decreased endurance, Decreased coordination, Difficulty walking, Decreased strength, Postural dysfunction  Visit Diagnosis: Unsteadiness on feet  Ataxic gait  Other symptoms and signs involving the nervous system  Muscle weakness (generalized)  Other abnormalities of gait and mobility     Problem List Patient Active Problem List   Diagnosis Date Noted  . Stroke ([place]) 05/06/2019  . Hypokalemia 05/06/2019  . Hyponatremia 05/06/2019  . Essential hypertension 05/06/2019  . Acute lower UTI 05/06/2019  . Special screening for malignant neoplasms, colon     Arliss Journey, PT, DPT 06/14/2019, 9:53 PM  [facility] [address] [place], [place], [address] Phone: [phone]   Fax:  [phone]  Name: Steven Hahn MRN: [id number] Date of Birth: Feb 16, 1951

## 2019-06-17 ENCOUNTER — Ambulatory Visit: Payer: Medicare HMO

## 2019-06-17 ENCOUNTER — Other Ambulatory Visit: Payer: Self-pay

## 2019-06-17 DIAGNOSIS — M6281 Muscle weakness (generalized): Secondary | ICD-10-CM

## 2019-06-17 DIAGNOSIS — R2689 Other abnormalities of gait and mobility: Secondary | ICD-10-CM

## 2019-06-17 DIAGNOSIS — R2681 Unsteadiness on feet: Secondary | ICD-10-CM

## 2019-06-17 DIAGNOSIS — R260 Ataxic gait: Secondary | ICD-10-CM

## 2019-06-17 DIAGNOSIS — R29818 Other symptoms and signs involving the nervous system: Secondary | ICD-10-CM

## 2019-06-17 NOTE — Therapy (Signed)
[facility] [address] [place], [place], [address] Phone: [phone]   Fax:  [phone]  Physical Therapy Treatment  Patient Details  Name: Steven Hahn MRN: [id number] Date of Birth: 1951/01/23 Referring Provider (PT): Sinda Du, MD   Encounter Date: 06/17/2019  PT End of Session - 06/17/19 1523    Visit Number  9    Number of Visits  17    Date for PT Re-Evaluation  07/18/19    Authorization Type  [employer] Medicare, VL: follow Medicare guidelines    PT Start Time  1527    PT Stop Time  1613    PT Time Calculation (min)  46 min    Equipment Utilized During Treatment  Gait belt    Activity Tolerance  Patient tolerated treatment well    Behavior During Therapy  [facility] for tasks assessed/performed       Past Medical History:  Diagnosis Date  . Hypertension     Past Surgical History:  Procedure Laterality Date  . BACK SURGERY    . COLONOSCOPY N/A 04/30/2015   Procedure: COLONOSCOPY;  Surgeon: Daneil Dolin, MD;  [facility];  Service: Endoscopy;  Laterality: N/A;  10:30 Am  . CYSTOSCOPY  05/02/2012   Procedure: CYSTOSCOPY;  Surgeon: Alexis Frock, MD;  Location: [facility];  Service: Urology;  Laterality: N/A;  removal of foreign body    There were no vitals filed for this visit.  Subjective Assessment - 06/17/19 1523    Subjective  Pt states that he forgot how to do his exercises and needs new pictures. He states that his fatigue is doing okay today. Pt denies pain.    Pertinent History  Bilateral small infarcts, hypertension, hyperlipidemia, tobacco    How long can you walk comfortably?  about 100'    Diagnostic tests  MRI: Bilateral small infarcts - Acute 9 mm infarction at the right lateral thalamus/posterior limb, Acute 5 mm infarction in the left lateral thalamus/posterior limb, acute 4 mm infarction in the left basal ganglia.    Patient Stated Goals  wants to get rid of RW, better walking     Currently in Pain?  No/denies    Pain Score  0-No pain                       [facility] Adult PT Treatment/Exercise - 06/17/19 0001      Ambulation/Gait   Ambulation/Gait  Yes    Ambulation/Gait Assistance  5: Supervision    Ambulation/Gait Assistance Details  No AD, Close SBA, Pt tripped over his L foot 1x but was able to catch himself with small step strategy.     Ambulation Distance (Feet)  448 Feet    Assistive device  None    Gait Pattern  Decreased step length - right;Decreased stance time - left;Decreased hip/knee flexion - left;Trunk flexed;Decreased stride length;Step-through pattern;Decreased arm swing - right;Decreased arm swing - left;Narrow base of support;Scissoring    Ambulation Surface  Level;Indoor      High Level Balance   High Level Balance Activities  Side stepping   tandem fwd/bwd   High Level Balance Comments  on airex beam for 4 laps in // bars w/CGA and intermittent UE support      Neuro Re-ed    Neuro Re-ed Details   in // bars SLS 5x 10 s hold and tandem stance 2x 30 seconds B w/close SBA and UE support to get into position  Knee/Hip Exercises: Standing   Heel Raises  Both;10 reps    Heel Raises Limitations  2 sets, VC for glute squeeze to prevent anteior COM over BOS, intermittent UE support needed.     Forward Step Up  10 reps    Forward Step Up Limitations  onto 4 inch step w/LLE w/slow controlled lower in order to address weakness in the L quad.       Knee/Hip Exercises: Prone   Other Prone Exercises  quadruped hydrant 10x B w/VC for core activation and tactile cueing to prevent rotation at the pelvis. Modified dead lift w/constant tactile cueing for Transverse abdominus activation to prevent exaggerated lordosis during activity.              PT Education - 06/17/19 1617    Education Details  Continue to work on [employer] including new balance activities.    Person(s) Educated  Patient    Methods  Explanation;Demonstration;Tactile  cues;Verbal cues;Handout    Comprehension  Verbalized understanding;Returned demonstration       PT Short Term Goals - 06/14/19 1543      PT SHORT TERM GOAL #1   Title  Patient will be independent with initial HEP in order to build upon functional gains in therapy. ALL STGS DUE 06/16/19    Time  4    Period  Weeks    Status  Achieved    Target Date  06/16/19      PT SHORT TERM GOAL #2   Title  Pt will undergo further assessment of 5x sit to stand and gait speed and write LTGs, as approrpiate.    Time  4    Period  Weeks    Status  Achieved      PT SHORT TERM GOAL #3   Title  Patient will increase BERG score to at least a 35/56 in order to decrease risk of falls.    Baseline  52/56 on 06/14/19    Time  4    Period  Weeks    Status  Achieved      PT SHORT TERM GOAL #4   Title  Patient will decrease TUG score using LRAD to at least 24 seconds in order to decrease risk of falls.    Baseline  12.3 seconds with no AD on 06/14/19    Time  4    Period  Weeks    Status  Achieved      PT SHORT TERM GOAL #5   Title  Patient will ambulate at least 200' on level indoor sufaces with LRAD with supervision in order to improve household mobility.    Baseline  230' with no AD, min guard and brief episode of min A when he demonstrated scissoring.    Time  4    Period  Weeks    Status  Achieved        PT Long Term Goals - 06/16/19 1707      PT LONG TERM GOAL #1   Title  Patient will be independent with final HEP in order to build upon functional gains in therapy. ALL LTGS DUE 07/18/19.    Time  8    Period  Weeks    Status  New      PT LONG TERM GOAL #2   Title  Patient will increase BERG score to at least a 55/56 in order to decrease risk of falls.    Baseline  52/56 on 06/14/19    Time  8  Period  Weeks    Status  New      PT LONG TERM GOAL #3   Title  Patient will ambulate at least 200' with supervision with no AD on level surfaces while scanning environment with no LOB.     Time  8    Period  Weeks    Status  New      PT LONG TERM GOAL #4   Title  Patient will ambulate at least 300' on on ulevel outdoor sufaces with LRAD vs. no AD with supervision in order to improve community mobility.    Time  8    Period  Weeks    Status  New      PT LONG TERM GOAL #5   Title  Patient will perform a curb, ramps, and 4 steps with supervision in order to improve community mobility.    Status  New            Plan - 06/17/19 1524    Clinical Impression Statement  Pt presents to physical therapy today without any reports of fatigue or pain. He was able to ambulate 448 ft w/o AD with one time episode of tripping over L foot and able to regain balance w/o incident using stepping strategy with small controlled step. Pt was able to progress with balance activities this session w/close SBA in the // bars. Pt has difficulty with engaging the L quad from terminal extension; practiced multiple times on 4 inch step with good results using tactile cueing, voice commands and demonstration for what was expected. Pt was able to tolerate glute ther-ex in quadruped with significant weakness noted as demonstrated by pt was able to only perform one set before he fatigues. Continue with current POC.    Personal Factors and Comorbidities  Comorbidity 1;Comorbidity 2    Comorbidities  HTN, HLD    Examination-Activity Limitations  Locomotion Level;Stand    PT Frequency  2x / week    PT Duration  8 weeks    PT Treatment/Interventions  ADLs/Self Care Home Management;DME Instruction;Gait training;Functional mobility training;Stair training;Neuromuscular re-education;Balance training;Therapeutic exercise;Therapeutic activities;Patient/family education;Energy conservation    PT Next Visit Plan  Assess balance activities. Continue to work on L quad activation from terminal stance and address glute weakness.    PT Home Exercise Plan  [id number], New code with balance exercises due to unable to open old  code Access Code: [email or web address]    Consulted and Agree with Plan of Care  Patient       Patient will benefit from skilled therapeutic intervention in order to improve the following deficits and impairments:  Abnormal gait, Decreased activity tolerance, Decreased balance, Decreased endurance, Decreased coordination, Difficulty walking, Decreased strength, Postural dysfunction  Visit Diagnosis: Unsteadiness on feet  Ataxic gait  Other symptoms and signs involving the nervous system  Muscle weakness (generalized)  Other abnormalities of gait and mobility     Problem List Patient Active Problem List   Diagnosis Date Noted  . Stroke (HCC) 05/06/2019  . Hypokalemia 05/06/2019  . Hyponatremia 05/06/2019  . Essential hypertension 05/06/2019  . Acute lower UTI 05/06/2019  . Special screening for malignant neoplasms, colon     Claudia Desanctisatherine H Grainger Mccarley, PT 06/17/2019, 4:24 PM  [facility] [address] [place], [place], [address] Phone: [phone]   Fax:  [phone]  Name: Steven RectorDaniel R Hahn MRN: [id number] Date of Birth: 06/12/1951

## 2019-06-21 ENCOUNTER — Other Ambulatory Visit: Payer: Self-pay

## 2019-06-21 ENCOUNTER — Ambulatory Visit: Payer: Medicare HMO | Admitting: Physical Therapy

## 2019-06-21 VITALS — BP 138/84

## 2019-06-21 DIAGNOSIS — R2681 Unsteadiness on feet: Secondary | ICD-10-CM

## 2019-06-21 DIAGNOSIS — M6281 Muscle weakness (generalized): Secondary | ICD-10-CM | POA: Diagnosis not present

## 2019-06-21 DIAGNOSIS — R260 Ataxic gait: Secondary | ICD-10-CM

## 2019-06-21 DIAGNOSIS — R29818 Other symptoms and signs involving the nervous system: Secondary | ICD-10-CM

## 2019-06-21 DIAGNOSIS — R2689 Other abnormalities of gait and mobility: Secondary | ICD-10-CM | POA: Diagnosis not present

## 2019-06-21 NOTE — Therapy (Signed)
[facility] [address] [place], [place], [address] Phone: [phone]   Fax:  [phone]  Physical Therapy Treatment/ 10th Visit Progress Note   Patient Details  Name: Steven Hahn MRN: [id number] Date of Birth: October 22, 1950 Referring Provider (PT): Kari Baars, MD  10th Visit Physical Therapy Progress Note  Dates of Reporting Period: 05/18/19 to 06/21/19     Encounter Date: 06/21/2019  PT End of Session - 06/21/19 1635    Visit Number  10    Number of Visits  17    Date for PT Re-Evaluation  07/18/19    Authorization Type  [employer]na Medicare, VL: follow Medicare guidelines    PT Start Time  1536    PT Stop Time  1617    PT Time Calculation (min)  41 min    Activity Tolerance  Patient tolerated treatment well    Behavior During Therapy  [facility] for tasks assessed/performed       Past Medical History:  Diagnosis Date  . Hypertension     Past Surgical History:  Procedure Laterality Date  . BACK SURGERY    . COLONOSCOPY N/A 04/30/2015   Procedure: COLONOSCOPY;  Surgeon: Corbin Ade, MD;  [facility];  Service: Endoscopy;  Laterality: N/A;  10:30 Am  . CYSTOSCOPY  05/02/2012   Procedure: CYSTOSCOPY;  Surgeon: Sebastian Ache, MD;  [facility];  Service: Urology;  Laterality: N/A;  removal of foreign body    Vitals:   06/21/19 1545  BP: 138/84    Subjective Assessment - 06/21/19 1547    Subjective  Moved a loveseat with his wife onto his porch the other day. Pt felt very tired afterwards. Is also feeling tired today.    Pertinent History  Bilateral small infarcts, hypertension, hyperlipidemia, tobacco    How long can you walk comfortably?  about 100'    Diagnostic tests  MRI: Bilateral small infarcts - Acute 9 mm infarction at the right lateral thalamus/posterior limb, Acute 5 mm infarction in the left lateral thalamus/posterior limb, acute 4 mm infarction in the left basal ganglia.    Patient Stated Goals  wants to get rid of RW, better walking                       [facility] Adult PT Treatment/Exercise - 06/21/19 0001      Neuro Re-ed    Neuro Re-ed Details   On blue foam on floor on blue floor mat at edge of mat table: 2 x 10 reps tall kneeling squats BUE support progressing to no UE assist. Cues for eccentric control. In tall kneeling position multiple reps static holds with no UE support with cues for glute and core activation.  With red theraband around distal thighs in tall kneeling position on foam: 1 x 10 reps R side stepping, 2 x 10 reps L side stepping. In low lunge position (LLE posterior on foam) and RLE anterior on blue mat - static holds, 4 reps, attempting to hold for 10-15 seconds, with cues for hip ABD and upright posture. Min guard required, with pt reporting increased fatigue after performing. Rest breaks taken in between exercises. Prone on mat table: hamstring curls with red theraband 1 x 10 reps for concentric, 1 x 10 reps resisting eccentric - cues for slow and controlled movement.              PT Education - 06/21/19 1657    Education Details  verbally reviewed pt's HEP given at last session    Person(s) Educated  Patient    Methods  Explanation    Comprehension  Verbalized understanding       PT Short Term Goals - 06/14/19 1543      PT SHORT TERM GOAL #1   Title  Patient will be independent with initial HEP in order to build upon functional gains in therapy. ALL STGS DUE 06/16/19    Time  4    Period  Weeks    Status  Achieved    Target Date  06/16/19      PT SHORT TERM GOAL #2   Title  Pt will undergo further assessment of 5x sit to stand and gait speed and write LTGs, as approrpiate.    Time  4    Period  Weeks    Status  Achieved      PT SHORT TERM GOAL #3   Title  Patient will increase BERG score to at least a 35/56 in order to decrease risk of falls.    Baseline  52/56 on 06/14/19    Time  4    Period  Weeks     Status  Achieved      PT SHORT TERM GOAL #4   Title  Patient will decrease TUG score using LRAD to at least 24 seconds in order to decrease risk of falls.    Baseline  12.3 seconds with no AD on 06/14/19    Time  4    Period  Weeks    Status  Achieved      PT SHORT TERM GOAL #5   Title  Patient will ambulate at least 200' on level indoor sufaces with LRAD with supervision in order to improve household mobility.    Baseline  230' with no AD, min guard and brief episode of min A when he demonstrated scissoring.    Time  4    Period  Weeks    Status  Achieved        PT Long Term Goals - 06/16/19 1707      PT LONG TERM GOAL #1   Title  Patient will be independent with final HEP in order to build upon functional gains in therapy. ALL LTGS DUE 07/18/19.    Time  8    Period  Weeks    Status  New      PT LONG TERM GOAL #2   Title  Patient will increase BERG score to at least a 55/56 in order to decrease risk of falls.    Baseline  52/56 on 06/14/19    Time  8    Period  Weeks    Status  New      PT LONG TERM GOAL #3   Title  Patient will ambulate at least 200' with supervision with no AD on level surfaces while scanning environment with no LOB.    Time  8    Period  Weeks    Status  New      PT LONG TERM GOAL #4   Title  Patient will ambulate at least 300' on on ulevel outdoor sufaces with LRAD vs. no AD with supervision in order to improve community mobility.    Time  8    Period  Weeks    Status  New      PT LONG TERM GOAL #5   Title  Patient will perform a curb, ramps, and 4 steps with supervision  in order to improve community mobility.    Status  New            Plan - 06/21/19 2053    Clinical Impression Statement  10th visit progress note: Assessed pt's STGs at last visit and pt has achieved all STGs. Since initial eval, pt's BERG score improved from a 35/56 to a 52/56. His TUG score also improved to 12.3 seconds without an AD, which puts pt at a lower risk of  falls. Pt is now able to ambulate at least 200' indoors without an AD and min guard. Revised LTGs, as appropriate. Focus of today's session was LE strengthening. Noted significant L hip ABD in low lunge position on floor mat with LLE posterior, needed min guard for balance with no UE support. Pt reported feeling increased LE fatigue after today's session focused on proximal LE strengthening. Will decrease frequency to 1x per week at pt's request due to his co-pay. Will continue to progress towards LTGs.    Personal Factors and Comorbidities  Comorbidity 1;Comorbidity 2    Comorbidities  HTN, HLD    Examination-Activity Limitations  Locomotion Level;Stand    PT Frequency  1x / week    PT Duration  4 weeks    PT Treatment/Interventions  ADLs/Self Care Home Management;DME Instruction;Gait training;Functional mobility training;Stair training;Neuromuscular re-education;Balance training;Therapeutic exercise;Therapeutic activities;Patient/family education;Energy conservation    PT Next Visit Plan  LLE strengthening (especially hip ABD) -quadraped/tall kneel. Gait training without AD - work on wide BOS (pt with tendency to scissor). Continue to work on L quad activation from terminal stance and address glute weakness. Balance on compliant surfaces.    PT Home Exercise Plan  [id number], New code with balance exercises due to unable to open old code Access Code: [email or web address]    Consulted and Agree with Plan of Care  Patient       Patient will benefit from skilled therapeutic intervention in order to improve the following deficits and impairments:  Abnormal gait, Decreased activity tolerance, Decreased balance, Decreased endurance, Decreased coordination, Difficulty walking, Decreased strength, Postural dysfunction  Visit Diagnosis: Unsteadiness on feet  Ataxic gait  Other symptoms and signs involving the nervous system  Muscle weakness (generalized)  Other abnormalities of gait and mobility     Problem  List Patient Active Problem List   Diagnosis Date Noted  . Stroke (HCC) 05/06/2019  . Hypokalemia 05/06/2019  . Hyponatremia 05/06/2019  . Essential hypertension 05/06/2019  . Acute lower UTI 05/06/2019  . Special screening for malignant neoplasms, colon     Drake Leachhloe N Maude Gloor, PT, DPT 06/21/2019, 8:54 PM  [facility] [address] [place], [place], [address] Phone: [phone]   Fax:  [phone]  Name: Jaquita RectorDaniel R Old MRN: [id number] Date of Birth: 29-Jul-1951

## 2019-06-23 ENCOUNTER — Encounter: Payer: Self-pay | Admitting: Physical Therapy

## 2019-06-23 ENCOUNTER — Other Ambulatory Visit: Payer: Self-pay

## 2019-06-23 ENCOUNTER — Ambulatory Visit: Payer: Medicare HMO | Attending: Pulmonary Disease | Admitting: Physical Therapy

## 2019-06-23 DIAGNOSIS — R2689 Other abnormalities of gait and mobility: Secondary | ICD-10-CM | POA: Insufficient documentation

## 2019-06-23 DIAGNOSIS — R29818 Other symptoms and signs involving the nervous system: Secondary | ICD-10-CM | POA: Diagnosis not present

## 2019-06-23 DIAGNOSIS — R260 Ataxic gait: Secondary | ICD-10-CM

## 2019-06-23 DIAGNOSIS — M6281 Muscle weakness (generalized): Secondary | ICD-10-CM

## 2019-06-23 DIAGNOSIS — R2681 Unsteadiness on feet: Secondary | ICD-10-CM | POA: Diagnosis not present

## 2019-06-24 NOTE — Therapy (Signed)
[facility] [address] [place], [place], [address] Phone: [phone]   Fax:  [phone]  Physical Therapy Treatment  Patient Details  Name: Steven Hahn MRN: [id number] Date of Birth: 07-28-1951 Referring Provider (PT): Kari Baars, MD   Encounter Date: 06/23/2019  PT End of Session - 06/23/19 1453    Visit Number  11    Number of Visits  17    Date for PT Re-Evaluation  07/18/19    Authorization Type  [employer] Medicare, VL: follow Medicare guidelines    PT Start Time  1446    PT Stop Time  1525    PT Time Calculation (min)  39 min    Equipment Utilized During Treatment  Gait belt    Activity Tolerance  Patient tolerated treatment well;Patient limited by fatigue    Behavior During Therapy  WFL for tasks assessed/performed       Past Medical History:  Diagnosis Date  . Hypertension     Past Surgical History:  Procedure Laterality Date  . BACK SURGERY    . COLONOSCOPY N/A 04/30/2015   Procedure: COLONOSCOPY;  Surgeon: Corbin Ade, MD;  [facility];  Service: Endoscopy;  Laterality: N/A;  10:30 Am  . CYSTOSCOPY  05/02/2012   Procedure: CYSTOSCOPY;  Surgeon: Sebastian Ache, MD;  Location: [facility];  Service: Urology;  Laterality: N/A;  removal of foreign body    There were no vitals filed for this visit.  Subjective Assessment - 06/23/19 1453    Subjective  Reports the HEP is making his legs tired. No falls or pain to report.    Pertinent History  Bilateral small infarcts, hypertension, hyperlipidemia, tobacco    How long can you walk comfortably?  about 100'    Diagnostic tests  MRI: Bilateral small infarcts - Acute 9 mm infarction at the right lateral thalamus/posterior limb, Acute 5 mm infarction in the left lateral thalamus/posterior limb, acute 4 mm infarction in the left basal ganglia.    Patient Stated Goals  wants to get rid of RW, better walking    Currently in Pain?  No/denies    Pain Score  0-No pain           [facility] Adult PT Treatment/Exercise - 06/23/19 1454      Transfers   Transfers  Sit to Stand;Stand to Sit    Sit to Stand  5: Supervision;With upper extremity assist;From bed;From chair/3-in-1    Stand to Sit  5: Supervision;With upper extremity assist;To bed;To chair/3-in-1    Floor to Transfer  4: Min guard    Floor to Transfer Details (indicate cue type and reason)  with UE support on mat table to get down onto red mat into tall kneeling and back up from red mat     Number of Reps  10 reps;1 set    Comments  sit<>stand with red band around LE's just above knees- cues to keep knees apart with band stretched tight for all 10 reps no UE support.       Ambulation/Gait   Ambulation/Gait  Yes    Ambulation/Gait Assistance  5: Supervision;4: Min guard    Ambulation/Gait Assistance Details  use of RW to enter/exit gym. no device used in session with supervision downgrading to min guard assist when fatigued. pt able to maintain hip width base of support with gait with no scissoring noted today.      Ambulation Distance (Feet)  --   around gym  Assistive device  None;Rolling walker    Gait Pattern  Decreased step length - right;Decreased stance time - left;Decreased hip/knee flexion - left;Trunk flexed;Decreased stride length;Step-through pattern;Decreased arm swing - right;Decreased arm swing - left;Decreased trunk rotation    Ambulation Surface  Level;Indoor      Neuro Re-ed    Neuro Re-ed Details   for strengthening/muscle re-ed: tall kneeling on red mat- mini squats x 10 reps with emphasis on equal LE weight bearing and full return to tall kneeling; moving LE bwd/fwd with tall posture for 10 reps each side with UE support on mat table; green band resisted side stepping for 3 laps each way, then green band resisted fwd/bwd stepping for 3 laps each way. 2 to 1 UE support, respectively, on mat table with cues to stay tall and for weight shifting; in quaduped:  alternating LE extension/raises for 10 reps each side with assist at pelvis for stability, cues for form/technique and minimal lift/height noted.                   Knee/Hip Exercises: Aerobic   Other Aerobic  Scifit level 3.0 for 6 minutes with goal >/= 50 rpm for strengthening and activity tolerance.       Knee/Hip Exercises: Machines for Strengthening   Cybex Leg Press  bil LE's-80#'s for 2 sets of 10 reps with cues for form, assist to prevent recurvatum with knees; single leg- 60#'s for 10 reps each leg with cues for form, and assist at knees.                PT Short Term Goals - 06/14/19 1543      PT SHORT TERM GOAL #1   Title  Patient will be independent with initial HEP in order to build upon functional gains in therapy. ALL STGS DUE 06/16/19    Time  4    Period  Weeks    Status  Achieved    Target Date  06/16/19      PT SHORT TERM GOAL #2   Title  Pt will undergo further assessment of 5x sit to stand and gait speed and write LTGs, as approrpiate.    Time  4    Period  Weeks    Status  Achieved      PT SHORT TERM GOAL #3   Title  Patient will increase BERG score to at least a 35/56 in order to decrease risk of falls.    Baseline  52/56 on 06/14/19    Time  4    Period  Weeks    Status  Achieved      PT SHORT TERM GOAL #4   Title  Patient will decrease TUG score using LRAD to at least 24 seconds in order to decrease risk of falls.    Baseline  12.3 seconds with no AD on 06/14/19    Time  4    Period  Weeks    Status  Achieved      PT SHORT TERM GOAL #5   Title  Patient will ambulate at least 200' on level indoor sufaces with LRAD with supervision in order to improve household mobility.    Baseline  230' with no AD, min guard and brief episode of min A when he demonstrated scissoring.    Time  4    Period  Weeks    Status  Achieved        PT Long Term Goals - 06/16/19 4098  PT LONG TERM GOAL #1   Title  Patient will be independent with final HEP in  order to build upon functional gains in therapy. ALL LTGS DUE 07/18/19.    Time  8    Period  Weeks    Status  New      PT LONG TERM GOAL #2   Title  Patient will increase BERG score to at least a 55/56 in order to decrease risk of falls.    Baseline  52/56 on 06/14/19    Time  8    Period  Weeks    Status  New      PT LONG TERM GOAL #3   Title  Patient will ambulate at least 200' with supervision with no AD on level surfaces while scanning environment with no LOB.    Time  8    Period  Weeks    Status  New      PT LONG TERM GOAL #4   Title  Patient will ambulate at least 300' on on ulevel outdoor sufaces with LRAD vs. no AD with supervision in order to improve community mobility.    Time  8    Period  Weeks    Status  New      PT LONG TERM GOAL #5   Title  Patient will perform a curb, ramps, and 4 steps with supervision in order to improve community mobility.    Status  New            Plan - 06/23/19 1454    Clinical Impression Statement  Today's skilled session continued to focus on gait with no device and strengthening. The pt does fatigue quickly with activity needing rest breaks throughout the session. The pt is progressing toward goals and should benefit from continued PT to progress toward unmet goals.    Personal Factors and Comorbidities  Comorbidity 1;Comorbidity 2    Comorbidities  HTN, HLD    Examination-Activity Limitations  Locomotion Level;Stand    PT Frequency  1x / week    PT Duration  4 weeks    PT Treatment/Interventions  ADLs/Self Care Home Management;DME Instruction;Gait training;Functional mobility training;Stair training;Neuromuscular re-education;Balance training;Therapeutic exercise;Therapeutic activities;Patient/family education;Energy conservation    PT Next Visit Plan  LLE strengthening (especially hip ABD) -quadraped/tall kneel. Gait training without AD - work on wide BOS (pt with tendency to scissor). Continue to work on L quad activation from  terminal stance and address glute weakness. Balance on compliant surfaces.    PT Home Exercise Plan  [email or web address], New code with balance exercises due to unable to open old code Access Code: [email or web address]    Consulted and Agree with Plan of Care  Patient       Patient will benefit from skilled therapeutic intervention in order to improve the following deficits and impairments:  Abnormal gait, Decreased activity tolerance, Decreased balance, Decreased endurance, Decreased coordination, Difficulty walking, Decreased strength, Postural dysfunction  Visit Diagnosis: Unsteadiness on feet  Ataxic gait  Other symptoms and signs involving the nervous system  Muscle weakness (generalized)  Other abnormalities of gait and mobility     Problem List Patient Active Problem List   Diagnosis Date Noted  . Stroke (HCC) 05/06/2019  . Hypokalemia 05/06/2019  . Hyponatremia 05/06/2019  . Essential hypertension 05/06/2019  . Acute lower UTI 05/06/2019  . Special screening for malignant neoplasms, colon    Sallyanne KusterKathy Estefani Bateson, PTA, [facility] [address], [address] [place], [place] [address] [phone] 06/24/19,  3:36 PM   Name: ORRY SIGL MRN: [id number] Date of Birth: 1951-04-21

## 2019-07-01 ENCOUNTER — Ambulatory Visit: Payer: Medicare HMO | Admitting: Physical Therapy

## 2019-07-07 ENCOUNTER — Ambulatory Visit: Payer: Medicare HMO | Admitting: Physical Therapy

## 2019-07-07 DIAGNOSIS — I10 Essential (primary) hypertension: Secondary | ICD-10-CM | POA: Diagnosis not present

## 2019-07-07 DIAGNOSIS — N529 Male erectile dysfunction, unspecified: Secondary | ICD-10-CM | POA: Diagnosis not present

## 2019-07-07 DIAGNOSIS — I679 Cerebrovascular disease, unspecified: Secondary | ICD-10-CM | POA: Diagnosis not present

## 2019-07-07 DIAGNOSIS — N400 Enlarged prostate without lower urinary tract symptoms: Secondary | ICD-10-CM | POA: Diagnosis not present

## 2019-07-07 DIAGNOSIS — E785 Hyperlipidemia, unspecified: Secondary | ICD-10-CM | POA: Diagnosis not present

## 2019-07-07 DIAGNOSIS — R69 Illness, unspecified: Secondary | ICD-10-CM | POA: Diagnosis not present

## 2019-07-07 DIAGNOSIS — Z23 Encounter for immunization: Secondary | ICD-10-CM | POA: Diagnosis not present

## 2019-07-08 ENCOUNTER — Ambulatory Visit: Payer: Medicare HMO | Admitting: Physical Therapy

## 2019-07-14 ENCOUNTER — Ambulatory Visit: Payer: Medicare HMO | Admitting: Physical Therapy

## 2019-07-14 ENCOUNTER — Encounter: Payer: Self-pay | Admitting: Physical Therapy

## 2019-07-14 ENCOUNTER — Other Ambulatory Visit: Payer: Self-pay

## 2019-07-14 VITALS — BP 138/89 | HR 58

## 2019-07-14 DIAGNOSIS — R2689 Other abnormalities of gait and mobility: Secondary | ICD-10-CM | POA: Diagnosis not present

## 2019-07-14 DIAGNOSIS — M6281 Muscle weakness (generalized): Secondary | ICD-10-CM | POA: Diagnosis not present

## 2019-07-14 DIAGNOSIS — R29818 Other symptoms and signs involving the nervous system: Secondary | ICD-10-CM

## 2019-07-14 DIAGNOSIS — R260 Ataxic gait: Secondary | ICD-10-CM

## 2019-07-14 DIAGNOSIS — R2681 Unsteadiness on feet: Secondary | ICD-10-CM

## 2019-07-17 NOTE — Therapy (Signed)
[facility] [address] [place], [place], [address] Phone: [phone]   Fax:  [phone]  Physical Therapy Treatment  Patient Details  Name: Steven Hahn MRN: [id number] Date of Birth: April 19, 1951 Referring Provider (PT): Sinda Du, MD   Encounter Date: 07/14/2019     07/14/19 1329  PT Visits / Re-Eval  Visit Number 12  Number of Visits 17  Date for PT Re-Evaluation 07/18/19  Authorization  Authorization Type Aetna Medicare, VL: follow Medicare guidelines  PT Time Calculation  PT Start Time 1318  PT Stop Time 1358  PT Time Calculation (min) 40 min  PT - End of Session  Equipment Utilized During Treatment Gait belt  Activity Tolerance Patient tolerated treatment well;Patient limited by fatigue  Behavior During Therapy [facility] for tasks assessed/performed    Past Medical History:  Diagnosis Date  . Hypertension     Past Surgical History:  Procedure Laterality Date  . BACK SURGERY    . COLONOSCOPY N/A 04/30/2015   Procedure: COLONOSCOPY;  Surgeon: Daneil Dolin, MD;  [facility];  Service: Endoscopy;  Laterality: N/A;  10:30 Am  . CYSTOSCOPY  05/02/2012   Procedure: CYSTOSCOPY;  Surgeon: Alexis Frock, MD;  [facility];  Service: Urology;  Laterality: N/A;  removal of foreign body    Vitals:   07/14/19 1327 07/14/19 1344  BP: (!) 134/99 138/89  Pulse: 62 (!) 58       07/14/19 1327  Symptoms/Limitations  Subjective Has not used the RW or any walking device for about a week now. No falls. Primary MD changed his BP meds because his K+ was low. Goes again this Friday for lab work/follow up.  Pertinent History Bilateral small infarcts, hypertension, hyperlipidemia, tobacco  Diagnostic tests MRI: Bilateral small infarcts - Acute 9 mm infarction at the right lateral thalamus/posterior limb, Acute 5 mm infarction in the left lateral thalamus/posterior limb, acute 4 mm infarction in the  left basal ganglia.  Patient Stated Goals wants to get rid of RW, better walking  Pain Assessment  Pain Score 0      07/14/19 1331  Transfers  Transfers Sit to Stand;Stand to Sit  Sit to Stand 5: Supervision;With upper extremity assist;From bed;From chair/3-in-1  Stand to Sit 5: Supervision;With upper extremity assist;To bed;To chair/3-in-1  Ambulation/Gait  Ambulation/Gait Yes  Ambulation/Gait Assistance 5: Supervision;4: Min guard  Ambulation/Gait Assistance Details min guard to min assist for balance on outdoor paved surfaces with veering noted, ataxic gait pattern.   Ambulation Distance (Feet) 500 Feet (x1), plus around gym with activity  Assistive device None  Gait Pattern Step-through pattern;Decreased stride length;Narrow base of support  Ambulation Surface Level;Indoor;Unlevel;Outdoor;Paved  High Level Balance  High Level Balance Activities Marching backwards;Marching forwards;Tandem walking (tandem/toe/heel walk fwd/bwd)  High Level Balance Comments on red/blue mats next to counter top: 3 laps each with cues on posture and ex form/technique.   Neuro Re-ed   Neuro Re-ed Details  for balance/muscle re-ed: gait around track scanning all directions randomly and working on speed changes. min guard to min assist for balance.        PT Short Term Goals - 06/14/19 1543      PT SHORT TERM GOAL #1   Title  Patient will be independent with initial HEP in order to build upon functional gains in therapy. ALL STGS DUE 06/16/19    Time  4    Period  Weeks    Status  Achieved  Target Date  06/16/19      PT SHORT TERM GOAL #2   Title  Pt will undergo further assessment of 5x sit to stand and gait speed and write LTGs, as approrpiate.    Time  4    Period  Weeks    Status  Achieved      PT SHORT TERM GOAL #3   Title  Patient will increase BERG score to at least a 35/56 in order to decrease risk of falls.    Baseline  52/56 on 06/14/19    Time  4    Period  Weeks    Status   Achieved      PT SHORT TERM GOAL #4   Title  Patient will decrease TUG score using LRAD to at least 24 seconds in order to decrease risk of falls.    Baseline  12.3 seconds with no AD on 06/14/19    Time  4    Period  Weeks    Status  Achieved      PT SHORT TERM GOAL #5   Title  Patient will ambulate at least 200' on level indoor sufaces with LRAD with supervision in order to improve household mobility.    Baseline  230' with no AD, min guard and brief episode of min A when he demonstrated scissoring.    Time  4    Period  Weeks    Status  Achieved        PT Long Term Goals - 06/16/19 1707      PT LONG TERM GOAL #1   Title  Patient will be independent with final HEP in order to build upon functional gains in therapy. ALL LTGS DUE 07/18/19.    Time  8    Period  Weeks    Status  New      PT LONG TERM GOAL #2   Title  Patient will increase BERG score to at least a 55/56 in order to decrease risk of falls.    Baseline  52/56 on 06/14/19    Time  8    Period  Weeks    Status  New      PT LONG TERM GOAL #3   Title  Patient will ambulate at least 200' with supervision with no AD on level surfaces while scanning environment with no LOB.    Time  8    Period  Weeks    Status  New      PT LONG TERM GOAL #4   Title  Patient will ambulate at least 300' on on ulevel outdoor sufaces with LRAD vs. no AD with supervision in order to improve community mobility.    Time  8    Period  Weeks    Status  New      PT LONG TERM GOAL #5   Title  Patient will perform a curb, ramps, and 4 steps with supervision in order to improve community mobility.    Status  New         07/14/19 1330  Plan  Clinical Impression Statement Today's skilled session continued to focus on gait with no device, balance reactions and LE strengthening. The pt is making steady progress toward goals and should benefit from continued PT to progress toward unmet goals.  Personal Factors and Comorbidities Comorbidity  1;Comorbidity 2  Comorbidities HTN, HLD  Examination-Activity Limitations Locomotion Level;Stand  Pt will benefit from skilled therapeutic intervention in order to improve on the following  deficits Abnormal gait;Decreased activity tolerance;Decreased balance;Decreased endurance;Decreased coordination;Difficulty walking;Decreased strength;Postural dysfunction  PT Frequency 1x / week  PT Duration 4 weeks  PT Treatment/Interventions ADLs/Self Care Home Management;DME Instruction;Gait training;Functional mobility training;Stair training;Neuromuscular re-education;Balance training;Therapeutic exercise;Therapeutic activities;Patient/family education;Energy conservation  PT Next Visit Plan check LTGs  PT Home Exercise Plan [email or web address], New code with balance exercises due to unable to open old code Access Code: [email or web address]  Consulted and Agree with Plan of Care Patient          Patient will benefit from skilled therapeutic intervention in order to improve the following deficits and impairments:  Abnormal gait, Decreased activity tolerance, Decreased balance, Decreased endurance, Decreased coordination, Difficulty walking, Decreased strength, Postural dysfunction  Visit Diagnosis: Unsteadiness on feet  Ataxic gait  Other symptoms and signs involving the nervous system  Muscle weakness (generalized)  Other abnormalities of gait and mobility     Problem List Patient Active Problem List   Diagnosis Date Noted  . Stroke (HCC) 05/06/2019  . Hypokalemia 05/06/2019  . Hyponatremia 05/06/2019  . Essential hypertension 05/06/2019  . Acute lower UTI 05/06/2019  . Special screening for malignant neoplasms, colon     Sallyanne KusterKathy Audwin Semper, PTA, [facility] [address], [address] [place], [place] [address] [phone] 07/17/19, 7:40 PM   Name: Steven Hahn MRN: [id number] Date of Birth: 02/20/1951

## 2019-07-22 ENCOUNTER — Other Ambulatory Visit: Payer: Self-pay

## 2019-07-22 ENCOUNTER — Ambulatory Visit: Payer: Medicare HMO | Admitting: Physical Therapy

## 2019-07-22 ENCOUNTER — Encounter: Payer: Self-pay | Admitting: Physical Therapy

## 2019-07-22 VITALS — BP 158/92 | HR 63

## 2019-07-22 DIAGNOSIS — R260 Ataxic gait: Secondary | ICD-10-CM | POA: Diagnosis not present

## 2019-07-22 DIAGNOSIS — R29818 Other symptoms and signs involving the nervous system: Secondary | ICD-10-CM

## 2019-07-22 DIAGNOSIS — M6281 Muscle weakness (generalized): Secondary | ICD-10-CM | POA: Diagnosis not present

## 2019-07-22 DIAGNOSIS — R2681 Unsteadiness on feet: Secondary | ICD-10-CM | POA: Diagnosis not present

## 2019-07-22 DIAGNOSIS — I10 Essential (primary) hypertension: Secondary | ICD-10-CM | POA: Diagnosis not present

## 2019-07-22 DIAGNOSIS — R2689 Other abnormalities of gait and mobility: Secondary | ICD-10-CM | POA: Diagnosis not present

## 2019-07-22 NOTE — Therapy (Signed)
[address] [place], [place], [address] Phone: [phone]   Fax:  [phone]  Physical Therapy Treatment/Re-Cert  Patient Details  Name: Steven Hahn MRN: [id number] Date of Birth: February 21, 1951 Referring Provider (PT): Sinda Du, MD   Encounter Date: 07/22/2019  PT End of Session - 07/23/19 1518    Visit Number  13    Number of Visits  17    Date for PT Re-Evaluation  09/17/19    Authorization Type  [employer], VL: follow Medicare guidelines    PT Start Time  1318    PT Stop Time  1400    PT Time Calculation (min)  42 min    Equipment Utilized During Treatment  Gait belt    Activity Tolerance  Patient tolerated treatment well;Patient limited by fatigue    Behavior During Therapy  WFL for tasks assessed/performed       Past Medical History:  Diagnosis Date  . Hypertension     Past Surgical History:  Procedure Laterality Date  . BACK SURGERY    . COLONOSCOPY N/A 04/30/2015   Procedure: COLONOSCOPY;  Surgeon: Daneil Dolin, MD;  [facility];  Service: Endoscopy;  Laterality: N/A;  10:30 Am  . CYSTOSCOPY  05/02/2012   Procedure: CYSTOSCOPY;  Surgeon: Alexis Frock, MD;  Location: [facility];  Service: Urology;  Laterality: N/A;  removal of foreign body    Vitals:   07/22/19 1324 07/22/19 1327 07/22/19 1359  BP: (!) 179/93 (!) 165/90 (!) 158/92  Pulse: 63      Subjective Assessment - 07/22/19 1328    Subjective  Has not been using the RW at home. No falls. BP is still running pretty high.    Pertinent History  Bilateral small infarcts, hypertension, hyperlipidemia, tobacco    Diagnostic tests  MRI: Bilateral small infarcts - Acute 9 mm infarction at the right lateral thalamus/posterior limb, Acute 5 mm infarction in the left lateral thalamus/posterior limb, acute 4 mm infarction in the left basal ganglia.    Patient Stated Goals  wants to get rid of RW, better walking     Currently in Pain?  No/denies         [facility] PT Assessment - 07/22/19 1336      Berg Balance Test   Sit to Stand  Able to stand without using hands and stabilize independently    Standing Unsupported  Able to stand safely 2 minutes    Sitting with Back Unsupported but Feet Supported on Floor or Stool  Able to sit safely and securely 2 minutes    Stand to Sit  Sits safely with minimal use of hands    Transfers  Able to transfer safely, minor use of hands    Standing Unsupported with Eyes Closed  Able to stand 10 seconds safely    Standing Unsupported with Feet Together  Able to place feet together independently and stand 1 minute safely    From Standing, Reach Forward with Outstretched Arm  Can reach confidently >25 cm (10")    From Standing Position, Pick up Object from Floor  Able to pick up shoe safely and easily    From Standing Position, Turn to Look Behind Over each Shoulder  Looks behind from both sides and weight shifts well    Turn 360 Degrees  Able to turn 360 degrees safely in 4 seconds or less    Standing Unsupported, Alternately Place Feet on Step/Stool  Able to stand  independently and safely and complete 8 steps in 20 seconds    Standing Unsupported, One Foot in [place] to plae foot ahead of the other independently and hold 30 seconds    Standing on One Leg  Able to lift leg independently and hold equal to or more than 3 seconds    Total Score  53    Berg comment:  53/56              07/25/19 0819  Ambulation/Gait  Ambulation/Gait Yes  Ambulation/Gait Assistance 5: Supervision;4: Min guard  Ambulation/Gait Assistance Details Ambulated throughout clinic as well as outdoors while asking pt to scan environment and perform head nods/head turns, pt demonstrating veering and ataxic gait pattern at times, requiring ocassional min guard especially for scissoring.   Ambulation Distance (Feet) 400 Feet (between indoors and outdoors)  Assistive device None  Gait Pattern  Step-through pattern;Decreased stride length;Narrow base of support;Ataxic;Scissoring  Ambulation Surface Level;Indoor;Unlevel;Outdoor;Paved  Stairs Yes  Stairs Assistance 5: Supervision  Stair Management Technique One rail Right;Alternating pattern;Forwards  Number of Stairs 4  Height of Stairs 6  Curb 5: Supervision  Curb Details (indicate cue type and reason) Initial cueing for technique x3 reps, pt demonstrating no LOB.         Access Code: [email or web address]  URL: [email or web address]  Date: 07/22/2019  Prepared by: Janann August   Verbally reviewed majority of HEP:    Exercises Proper Sit to Stand Technique - 10 reps - 3 sets - 2x daily - 5x weekly Clamshell with Resistance - 10 reps - 2 sets - 1x daily - 7x weekly [place] - 5 reps - 2 sets - 2x daily - 7x weekly Side Stepping with Resistance at Thighs - 10 reps - 1 sets - 2x daily - 7x weekly  Access Code: [email or web address]  URL: [email or web address]  Date: 07/22/2019  Prepared by: Janann August   Exercises Standing Single Leg Stance with Counter Support - 5 reps - 1 sets  - 10 seconds hold - 1x daily - 7x weekly Standing Tandem Balance with Counter Support - 2 reps - 1 sets - 30 seconds hold - 1x daily - 7x weekly - reviewed at [place] rises with counter support - 10 reps - 2 sets - 1x daily - 7x weekly Quadruped Hip Abduction and External Rotation - 10 reps - 1 sets  - 1x daily - 7x weekly Tandem Walking with Counter Support - 3 sets - 2x daily - 7x weekly - reviewed at countertop  Heel Walking - 3 sets - 3x daily - 7x weekly          PT Education - 07/23/19 1517    Education Details  progress towards LTGs, reviewing pt's HEP for LE strength and balance, following up with MD due to continued high BP at rest and after minimal activity.    Person(s) Educated  Patient    Methods  Explanation    Comprehension  Verbalized understanding       PT Short Term Goals - 06/14/19  1543      PT SHORT TERM GOAL #1   Title  Patient will be independent with initial HEP in order to build upon functional gains in therapy. ALL STGS DUE 06/16/19    Time  4    Period  Weeks    Status  Achieved    Target Date  06/16/19      PT SHORT TERM GOAL #2   Title  Pt will undergo further  assessment of 5x sit to stand and gait speed and write LTGs, as approrpiate.    Time  4    Period  Weeks    Status  Achieved      PT SHORT TERM GOAL #3   Title  Patient will increase BERG score to at least a 35/56 in order to decrease risk of falls.    Baseline  52/56 on 06/14/19    Time  4    Period  Weeks    Status  Achieved      PT SHORT TERM GOAL #4   Title  Patient will decrease TUG score using LRAD to at least 24 seconds in order to decrease risk of falls.    Baseline  12.3 seconds with no AD on 06/14/19    Time  4    Period  Weeks    Status  Achieved      PT SHORT TERM GOAL #5   Title  Patient will ambulate at least 200' on level indoor sufaces with LRAD with supervision in order to improve household mobility.    Baseline  230' with no AD, min guard and brief episode of min A when he demonstrated scissoring.    Time  4    Period  Weeks    Status  Achieved        PT Long Term Goals - 07/22/19 1329      PT LONG TERM GOAL #1   Title  Patient will be independent with final HEP in order to build upon functional gains in therapy. ALL LTGS DUE 07/18/19.    Baseline  pt reports he has not been doing all of his exercises at home.    Time  8    Period  Weeks    Status  Partially Met      PT LONG TERM GOAL #2   Title  Patient will increase BERG score to at least a 55/56 in order to decrease risk of falls.    Baseline  53/56 on 07/22/19    Time  8    Period  Weeks    Status  Not Met      PT LONG TERM GOAL #3   Title  Patient will ambulate at least 200' with supervision with no AD on level surfaces while scanning environment with no LOB.    Baseline  one episode of min guard when  asking pt to scan environment, had brief episodes of scissoring.    Time  8    Period  Weeks    Status  Partially Met      PT LONG TERM GOAL #4   Title  Patient will ambulate at least 300' on on unlevel outdoor sufaces with LRAD vs. no AD with supervision in order to improve community mobility.    Baseline  on 07/14/19 - min guard to min assist for balance on outdoor paved surfaces with veering noted, ataxic gait pattern, ambulated 500'    Time  8    Period  Weeks    Status  Not Met      PT LONG TERM GOAL #5   Title  Patient will perform a curb, ramps, and 4 steps with supervision in order to improve community mobility.    Baseline  supervision for curb and ramps, able to perform 4 steps with single hand rail and step over step pattern.    Status  Achieved       updated goals for re-cert:  PT  Long Term Goals - 07/25/19 7681      PT LONG TERM GOAL #1   Title  Patient will be independent with final HEP in order to build upon functional gains in therapy. ALL LTGS DUE 07/18/19.    Baseline  pt reports he has not been doing all of his exercises at home.    Time  4    Period  Weeks    Status  New      PT LONG TERM GOAL #2   Title  Patient will increase BERG score to at least a 55/56 in order to decrease risk of falls.    Baseline  53/56 on 07/22/19    Time  4    Period  Weeks    Status  New      PT LONG TERM GOAL #3   Title  Patient will ambulate at least 200' with supervision with no AD on level surfaces while scanning environment with no LOB.    Baseline  one episode of min guard when asking pt to scan environment, had brief episodes of scissoring.    Time  4    Period  Weeks    Status  New      PT LONG TERM GOAL #4   Title  Patient will ambulate at least 300' on on unlevel outdoor sufaces with LRAD vs. no AD with supervision in order to improve community mobility.    Baseline  on 07/14/19 - min guard to min assist for balance on outdoor paved surfaces with veering noted,  ataxic gait pattern, ambulated 500'    Time  4    Period  Weeks    Status  New      PT LONG TERM GOAL #5   Title  Patient will undergo assessment of FGA - write goal as appropriate.    Baseline  not yet assessed.    Time  4    Period  Weeks    Status  New           07/25/19 0814  Plan  Clinical Impression Statement Focus of today's skilled session was checking pt's LTGs for D/C vs. re-cert. Pt's progress has been limited this past month due to decreasing frequency down to 1x week vs. 2x week due to finances and driving distance to clinic. Pt also had 1-2 no shows/cancels. Pt arrives today with no AD and has weened himself off of his RW at home and has not been using it for the past couple of weeks and reports he has not been doing all of his HEP.  Pt's BERG score today indicates a low risk for falls. Pt has only met LTG #5 in regards to performing a curb, ramps, and 4 steps with supervision. Pt has partially met/not met his goals in regards to gait - pt requires brief min guard due to episodes of scissoring and ataxic gait pattern, especially when asking pt to perform dynamic gait tasks like scan his environment. Reviewed pt's HEP for balance and LE strengthening, as pt states that he has not been performing all exercises at home. Seated rest breaks taken throughout session due to pt with elevated diastolic BP, pt reports he has taken his medication and is asymptomatic. Instructed pt to follow up with his physician regarding BP. Discussed with pt about re-cert for more visits vs. D/C due to deficits, especially ataxic gait pattern without an AD. Pt stating that finances are tough right now and will let therapist know in  a couple of weeks if he will be able to afford coming for more therapy. Will currently put pt on hold for the time being. Plan to see for 1x week for an additional 4 weeks if finances allow to address gait, balance, LE strength, and safety.  Personal Factors and Comorbidities  Comorbidity 1;Comorbidity 2  Comorbidities HTN, HLD  Examination-Activity Limitations Locomotion Level;Stand  Pt will benefit from skilled therapeutic intervention in order to improve on the following deficits Abnormal gait;Decreased activity tolerance;Decreased balance;Decreased endurance;Decreased coordination;Difficulty walking;Decreased strength;Postural dysfunction  PT Frequency 1x / week  PT Duration 4 weeks  PT Treatment/Interventions ADLs/Self Care Home Management;DME Instruction;Gait training;Functional mobility training;Stair training;Neuromuscular re-education;Balance training;Therapeutic exercise;Therapeutic activities;Patient/family education;Energy conservation  PT Next Visit Plan perform FGA, add corner balance HEP.  PT Home Exercise Plan [email or web address], New code with balance exercises due to unable to open old code Access Code: [email or web address]  Consulted and Agree with Plan of Care Patient       Patient will benefit from skilled therapeutic intervention in order to improve the following deficits and impairments:     Visit Diagnosis: Unsteadiness on feet  Ataxic gait  Other symptoms and signs involving the nervous system     Problem List Patient Active Problem List   Diagnosis Date Noted  . Stroke ([place]) 05/06/2019  . Hypokalemia 05/06/2019  . Hyponatremia 05/06/2019  . Essential hypertension 05/06/2019  . Acute lower UTI 05/06/2019  . Special screening for malignant neoplasms, colon     Arliss Journey, PT, DPT  07/23/2019, 3:24 PM  [facility] [address] [place], [place], [address] Phone: [phone]   Fax:  [phone]  Name: Steven Hahn MRN: [id number] Date of Birth: [birth year]

## 2019-07-22 NOTE — Patient Instructions (Signed)
Access Code: [email or web address]  URL: [email or web address]  Date: 07/22/2019  Prepared by: Janann August   Exercises Proper Sit to Stand Technique - 10 reps - 3 sets - 2x daily - 5x weekly Clamshell with Resistance - 10 reps - 2 sets - 1x daily - 7x weekly [place] - 5 reps - 2 sets - 2x daily - 7x weekly Side Stepping with Resistance at Thighs - 10 reps - 1 sets - 2x daily - 7x weekly  Access Code: [email or web address]  URL: [email or web address]  Date: 07/22/2019  Prepared by: Janann August   Exercises Standing Single Leg Stance with Counter Support - 5 reps - 1 sets                   - 10 seconds hold - 1x daily - 7x weekly Standing Tandem Balance with Counter Support - 2 reps - 1 sets - 30 seconds hold - 1x daily - 7x weekly Side Stepping with Counter Support - 10 reps - 4 sets - 1x daily - 7x weekly Heel rises with counter support - 10 reps - 2 sets - 1x daily - 7x weekly Quadruped Hip Abduction and External Rotation - 10 reps - 1 sets                            - 1x daily - 7x weekly Tandem Walking with Counter Support - 3 sets - 2x daily - 7x weekly Heel Walking - 3 sets - 3x daily - 7x weekly

## 2019-08-04 DIAGNOSIS — R69 Illness, unspecified: Secondary | ICD-10-CM | POA: Diagnosis not present

## 2019-08-04 DIAGNOSIS — R6889 Other general symptoms and signs: Secondary | ICD-10-CM | POA: Diagnosis not present

## 2019-08-04 DIAGNOSIS — I10 Essential (primary) hypertension: Secondary | ICD-10-CM | POA: Diagnosis not present

## 2019-08-22 DIAGNOSIS — I10 Essential (primary) hypertension: Secondary | ICD-10-CM | POA: Diagnosis not present

## 2019-08-22 DIAGNOSIS — M545 Low back pain: Secondary | ICD-10-CM | POA: Diagnosis not present

## 2019-08-22 DIAGNOSIS — Z8673 Personal history of transient ischemic attack (TIA), and cerebral infarction without residual deficits: Secondary | ICD-10-CM | POA: Diagnosis not present

## 2019-08-22 DIAGNOSIS — M5136 Other intervertebral disc degeneration, lumbar region: Secondary | ICD-10-CM | POA: Diagnosis not present

## 2019-08-23 DIAGNOSIS — R6889 Other general symptoms and signs: Secondary | ICD-10-CM | POA: Insufficient documentation

## 2019-08-23 DIAGNOSIS — Z720 Tobacco use: Secondary | ICD-10-CM | POA: Insufficient documentation

## 2019-08-23 NOTE — Progress Notes (Deleted)
Patient referred by [name], * for ***  Subjective:   Steven Hahn, male    DOB: 1950-11-01, 68 y.o.   MRN: [id number]  *** No chief complaint on file.   *** HPI  68 y.o. Caucasian male with hypertension, tobacco abuse, decreased exercise tolerance.   *** Past Medical History:  Diagnosis Date  . Hypertension     *** Past Surgical History:  Procedure Laterality Date  . BACK SURGERY    . COLONOSCOPY N/A 04/30/2015   Procedure: COLONOSCOPY;  Surgeon: Daneil Dolin, MD;  [facility];  Service: Endoscopy;  Laterality: N/A;  10:30 Am  . CYSTOSCOPY  05/02/2012   Procedure: CYSTOSCOPY;  Surgeon: Alexis Frock, MD;  Location: [facility];  Service: Urology;  Laterality: N/A;  removal of foreign body    *** Social History   Tobacco Use  Smoking Status Current Every Day Smoker  . Types: Cigarettes  Smokeless Tobacco Never Used    Social History   Substance and Sexual Activity  Alcohol Use No    *** Family History  Problem Relation Age of Onset  . Pancreatic cancer Mother   . Heart disease Father     *** Current Outpatient Medications on File Prior to Visit  Medication Sig Dispense Refill  . amoxicillin (AMOXIL) 500 MG capsule Take 500 mg by mouth 3 (three) times daily.    Marland Kitchen aspirin 325 MG tablet Take 1 tablet (325 mg total) by mouth daily. 30 tablet 0  . atorvastatin (LIPITOR) 80 MG tablet Take 1 tablet (80 mg total) by mouth daily at 6 PM. 30 tablet 12  . clopidogrel (PLAVIX) 75 MG tablet Take 1 tablet (75 mg total) by mouth daily. 30 tablet 11  . LISINOPRIL PO Take 50 mg by mouth 1 day or 1 dose.    Marland Kitchen lisinopril-hydrochlorothiazide (PRINZIDE,ZESTORETIC) 20-25 MG per tablet Take 1 tablet by mouth daily.  9  . metoprolol succinate (TOPROL-XL) 50 MG 24 hr tablet Take 50 mg by mouth daily. Take with or immediately following a meal.    . Tamsulosin HCl (FLOMAX) 0.4 MG CAPS Take 0.4 mg by mouth daily after supper.      No current  facility-administered medications on file prior to visit.     Cardiovascular and other pertinent studies:  *** EKG ***/***/202***: ***  *** Recent labs: 07/22/2019: Glucose 97, BUN/Cr 7/0.91. EGFR 82. Na/K 138/3.8. Rest of the CMP normal.  Sodium improved from 130 on 07/07/2019 CBC, A1C, lipid panel not available   *** Review of Systems  Constitution: Negative for decreased appetite, malaise/fatigue, weight gain and weight loss.  HENT: Negative for congestion.   Eyes: Negative for visual disturbance.  Cardiovascular: Negative for chest pain, dyspnea on exertion, leg swelling, palpitations and syncope.       Decreased exercise tolerance  Respiratory: Negative for cough.   Endocrine: Negative for cold intolerance.  Hematologic/Lymphatic: Does not bruise/bleed easily.  Skin: Negative for itching and rash.  Musculoskeletal: Negative for myalgias.  Gastrointestinal: Negative for abdominal pain, nausea and vomiting.  Genitourinary: Negative for dysuria.  Neurological: Negative for dizziness and weakness.  Psychiatric/Behavioral: The patient is not nervous/anxious.   All other systems reviewed and are negative.       *** There were no vitals filed for this visit.  *** There is no height or weight on file to calculate BMI. There were no vitals filed for this visit.  *** Objective:   Physical Exam  Constitutional: He is  oriented to person, place, and time. He appears well-developed and well-nourished. No distress.  HENT:  Head: Normocephalic and atraumatic.  Eyes: Pupils are equal, round, and reactive to light. Conjunctivae are normal.  Neck: No JVD present.  Cardiovascular: Normal rate, regular rhythm and intact distal pulses.  No murmur heard. Pulmonary/Chest: Effort normal and breath sounds normal. He has no wheezes. He has no rales.  Abdominal: Soft. Bowel sounds are normal. There is no rebound.  Musculoskeletal:        General: No edema.  Lymphadenopathy:    He  has no cervical adenopathy.  Neurological: He is alert and oriented to person, place, and time. No cranial nerve deficit.  Skin: Skin is warm and dry.  Psychiatric: He has a normal mood and affect.  Nursing note and vitals reviewed.     ***     Assessment & Recommendations:   68 y.o. Caucasian male with hypertension, tobacco abuse, decreased exercise tolerance.   1. Decreased exercise tolerance ***  2. Essential hypertension ***  3. Tobacco abuse ***      Thank you for referring the patient to [place]. Please feel free to contact with any questions.  Nigel Mormon, MD [facility] Pager: [phone] Office: [phone]

## 2019-08-24 ENCOUNTER — Ambulatory Visit: Payer: Self-pay | Admitting: Cardiology

## 2019-09-01 ENCOUNTER — Other Ambulatory Visit: Payer: Self-pay

## 2019-09-01 ENCOUNTER — Encounter: Payer: Self-pay | Admitting: Cardiology

## 2019-09-01 ENCOUNTER — Ambulatory Visit: Payer: Medicare HMO | Admitting: Cardiology

## 2019-09-01 ENCOUNTER — Ambulatory Visit: Payer: Medicare HMO

## 2019-09-01 VITALS — BP 166/100 | HR 76 | Temp 98.4°F | Ht 66.0 in | Wt 161.0 lb

## 2019-09-01 DIAGNOSIS — I6340 Cerebral infarction due to embolism of unspecified cerebral artery: Secondary | ICD-10-CM

## 2019-09-01 DIAGNOSIS — R6889 Other general symptoms and signs: Secondary | ICD-10-CM | POA: Diagnosis not present

## 2019-09-01 DIAGNOSIS — Z8673 Personal history of transient ischemic attack (TIA), and cerebral infarction without residual deficits: Secondary | ICD-10-CM | POA: Diagnosis not present

## 2019-09-01 DIAGNOSIS — R69 Illness, unspecified: Secondary | ICD-10-CM | POA: Diagnosis not present

## 2019-09-01 DIAGNOSIS — F17210 Nicotine dependence, cigarettes, uncomplicated: Secondary | ICD-10-CM

## 2019-09-01 DIAGNOSIS — G459 Transient cerebral ischemic attack, unspecified: Secondary | ICD-10-CM | POA: Diagnosis not present

## 2019-09-01 DIAGNOSIS — I10 Essential (primary) hypertension: Secondary | ICD-10-CM

## 2019-09-01 DIAGNOSIS — G464 Cerebellar stroke syndrome: Secondary | ICD-10-CM | POA: Diagnosis not present

## 2019-09-01 DIAGNOSIS — F17200 Nicotine dependence, unspecified, uncomplicated: Secondary | ICD-10-CM

## 2019-09-01 MED ORDER — AMLODIPINE BESYLATE 5 MG PO TABS: 5.0000 mg | ORAL_TABLET | Freq: Every day | ORAL | 3 refills | Status: AC

## 2019-09-01 NOTE — Progress Notes (Signed)
Patient referred by [name], * for decreased exercise tolerance  Subjective:   Steven Hahn, male    DOB: June 21, 1951, 68 y.o.   MRN: [id number]   Chief Complaint  Patient presents with   Cerebrovascular Accident   New Patient (Initial Visit)   Hypertension     HPI  68 y.o. Caucasian male with hypertension, recent bilateral strokes, smoker, referred for evaluation of decreased exercise tolerance.  MRA head showed small bilateral infarcts. Intracranial MR angiography does not show any large or medium vessel occlusion or any correctable proximal stenosis. More distal branch vessels show atherosclerotic irregularity diffusely.  He was recommended 1 month DAPT, followed by Plavix, along with statin.  Echocardiogram was relatively normal, although agitated saline contrast study was not performed to look for interatrial shunt. He wore a monitor for 30 days, which was reportedly normal. I do not have those records. However, patient reports he has noticed significant variations in his heart rate. He underwent neuro rehab and nearly regained all strength on left side.    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 He used to stay active prior to the stroke. However, further review suggests that he had had decreased exercise tolerance and exertional dyspnea even prior to the stroke. His blood pressure has been uncontrolled. He continues to smoke 1 PPD. He denies any chest pain.    Past Medical History:  Diagnosis Date   Hypertension      Past Surgical History:  Procedure Laterality Date   BACK SURGERY     COLONOSCOPY N/A 04/30/2015   Procedure: COLONOSCOPY;  Surgeon: Daneil Dolin, MD;  Location: [facility];  Service: Endoscopy;  Laterality: N/A;  10:30 Am   CYSTOSCOPY  05/02/2012   Procedure: CYSTOSCOPY;  Surgeon: Alexis Frock, MD;  Location: [facility];  Service: Urology;  Laterality: N/A;  removal of foreign body     Social History   Tobacco  Use  Smoking Status Current Every Day Smoker   Types: Cigarettes  Smokeless Tobacco Never Used    Social History   Substance and Sexual Activity  Alcohol Use No     Family History  Problem Relation Age of Onset   Pancreatic cancer Mother    Heart disease Father      Current Outpatient Medications on File Prior to Visit  Medication Sig Dispense Refill   atorvastatin (LIPITOR) 80 MG tablet Take 1 tablet (80 mg total) by mouth daily at 6 PM. 30 tablet 12   clopidogrel (PLAVIX) 75 MG tablet Take 1 tablet (75 mg total) by mouth daily. 30 tablet 11   LISINOPRIL PO Take 40 mg by mouth 1 day or 1 dose.      metoprolol succinate (TOPROL-XL) 50 MG 24 hr tablet Take 50 mg by mouth daily. Take with or immediately following a meal.     Tamsulosin HCl (FLOMAX) 0.4 MG CAPS Take 0.4 mg by mouth daily after supper.      hydrochlorothiazide (HYDRODIURIL) 25 MG tablet Take 25 mg by mouth every morning.     tizanidine (ZANAFLEX) 2 MG capsule Take 2 mg by mouth 3 (three) times daily.     No current facility-administered medications on file prior to visit.    Cardiovascular and other pertinent studies:   EKG 09/01/2019: Sinus rhythm 86 bpm. Left atrial enlargement. IVCD.  Echocardiogram 05/06/2019: EF 60-65%. Grade 1 DD. Mild to moderate aortic annular calcification.  No other abnormalities noted. Bubble study not performed.  MRA head  05/06/2019: Acute 9 mm infarction at the right lateral thalamus/posterior limb internal capsule.  Acute 5 mm infarction in the left lateral thalamus/posterior limb internal capsule.  Acute 4 mm infarction in the left basal ganglia.  Background pattern of extensive chronic small-vessel ischemic change elsewhere throughout the brain.  Neck MR angiography does not show any stenosis or occlusion of the neck vessels.  Intracranial MR angiography does not show any large or medium vessel occlusion or any correctable proximal stenosis. More  distal branch vessels show atherosclerotic irregularity diffusely.  Multiple acute infarctions of this nature suggests that there could have been a generalized hypoperfusion event.   Recent labs: 05/07/2019: Glucose 93, BUN/Cr 10/0.72. EGFR >60. Na/K 140/3.9. Rest of the CMP normal H/H 15/44. MCV 87. Platelets 284 HbA1C 5.4% Chol 154, TG 84, HDL 31, LDL 106   Review of Systems  Constitution: Negative for decreased appetite, malaise/fatigue, weight gain and weight loss.  HENT: Negative for congestion.   Eyes: Negative for visual disturbance.  Cardiovascular: Positive for dyspnea on exertion. Negative for chest pain, leg swelling, palpitations and syncope.  Respiratory: Negative for cough.   Endocrine: Negative for cold intolerance.  Hematologic/Lymphatic: Does not bruise/bleed easily.  Skin: Negative for itching and rash.  Musculoskeletal: Negative for myalgias.  Gastrointestinal: Negative for abdominal pain, nausea and vomiting.  Genitourinary: Negative for dysuria.  Neurological: Negative for dizziness and weakness.       Minimal weakness LUE LLE  Psychiatric/Behavioral: The patient is not nervous/anxious.   All other systems reviewed and are negative.        Vitals:   09/01/19 1247 09/01/19 1300  BP: (!) 187/95 (!) 166/100  Pulse: 67 76  Temp: 98.4 F (36.9 C)   SpO2: 97%      Body mass index is 25.99 kg/m. Filed Weights   09/01/19 1247  Weight: 161 lb (73 kg)     Objective:   Physical Exam  Constitutional: He is oriented to person, place, and time. He appears well-developed and well-nourished. No distress.  HENT:  Head: Normocephalic and atraumatic.  Eyes: Pupils are equal, round, and reactive to light. Conjunctivae are normal.  Neck: No JVD present.  Cardiovascular: Normal rate, regular rhythm and intact distal pulses.  No murmur heard. Pulmonary/Chest: Effort normal and breath sounds normal. He has no wheezes. He has no rales.  Abdominal: Soft.  Bowel sounds are normal. There is no rebound.  Musculoskeletal:        General: No edema.  Lymphadenopathy:    He has no cervical adenopathy.  Neurological: He is alert and oriented to person, place, and time. No cranial nerve deficit.  Minimal decreased strength LUE LLE  Skin: Skin is warm and dry.  Psychiatric: He has a normal mood and affect.  Nursing note and vitals reviewed.         Assessment & Recommendations:   68 y.o. Caucasian male with hypertension, recent bilateral strokes, smoker, referred for evaluation of decreased exercise tolerance.  Decreased exercise tolerance, dyspnea on exertion: High risk factors for CAD with hypertension, hyperlipidemia, tobacco abuse, recent stroke. Baseline abnormal EKG with IVCD. Recommend lexiscan nuclear stress test.  Bilateral strokes: Cardioembolic etiology remains in the differential. Will repeat 30 day event monitor given ongoing sucpision for Afib. Will also obtain repeat echocardiogram, this time with a bubble study to look for interatrial shunt. Continue aggressive risk factor modification.  Tobacoc dependence: Tobacco cessation counseling: - Currently smoking 1 packs/day   - Patient was informed of the dangers of  tobacco abuse including stroke, cancer, and MI, as well as benefits of tobacco cessation. - Patient is willing to quit at this time. - Approximately 6 mins were spent counseling patient cessation techniques. We discussed various methods to help quit smoking, including deciding on a date to quit, joining a support group, pharmacological agents. Patient would like to quit on his own.. - I will reassess his progress at the next follow-up visit   Thank you for referring the patient to [place]. Please feel free to contact with any questions.  Nigel Mormon, MD [facility] Pager: [phone] Office: [phone]

## 2019-09-02 ENCOUNTER — Encounter: Payer: Self-pay | Admitting: Cardiology

## 2019-09-02 DIAGNOSIS — F17200 Nicotine dependence, unspecified, uncomplicated: Secondary | ICD-10-CM | POA: Insufficient documentation

## 2019-09-05 DIAGNOSIS — I69393 Ataxia following cerebral infarction: Secondary | ICD-10-CM | POA: Diagnosis not present

## 2019-09-05 DIAGNOSIS — E7849 Other hyperlipidemia: Secondary | ICD-10-CM | POA: Diagnosis not present

## 2019-09-05 DIAGNOSIS — I10 Essential (primary) hypertension: Secondary | ICD-10-CM | POA: Diagnosis not present

## 2019-09-05 DIAGNOSIS — I69398 Other sequelae of cerebral infarction: Secondary | ICD-10-CM | POA: Diagnosis not present

## 2019-09-19 ENCOUNTER — Ambulatory Visit (INDEPENDENT_AMBULATORY_CARE_PROVIDER_SITE_OTHER): Payer: Medicare HMO

## 2019-09-19 ENCOUNTER — Other Ambulatory Visit: Payer: Self-pay

## 2019-09-19 DIAGNOSIS — R6889 Other general symptoms and signs: Secondary | ICD-10-CM | POA: Diagnosis not present

## 2019-09-19 DIAGNOSIS — Z8673 Personal history of transient ischemic attack (TIA), and cerebral infarction without residual deficits: Secondary | ICD-10-CM

## 2019-09-19 DIAGNOSIS — F17210 Nicotine dependence, cigarettes, uncomplicated: Secondary | ICD-10-CM

## 2019-09-19 DIAGNOSIS — F17200 Nicotine dependence, unspecified, uncomplicated: Secondary | ICD-10-CM

## 2019-09-19 DIAGNOSIS — I10 Essential (primary) hypertension: Secondary | ICD-10-CM | POA: Diagnosis not present

## 2019-09-19 DIAGNOSIS — R69 Illness, unspecified: Secondary | ICD-10-CM | POA: Diagnosis not present

## 2019-09-21 ENCOUNTER — Ambulatory Visit: Payer: Medicare HMO

## 2019-09-21 ENCOUNTER — Other Ambulatory Visit: Payer: Self-pay

## 2019-09-28 ENCOUNTER — Telehealth: Payer: Medicare HMO | Admitting: Cardiology

## 2019-10-03 ENCOUNTER — Other Ambulatory Visit: Payer: Self-pay

## 2019-10-03 ENCOUNTER — Ambulatory Visit: Payer: Medicare HMO

## 2019-10-03 DIAGNOSIS — I6340 Cerebral infarction due to embolism of unspecified cerebral artery: Secondary | ICD-10-CM | POA: Diagnosis not present

## 2019-10-03 DIAGNOSIS — I10 Essential (primary) hypertension: Secondary | ICD-10-CM | POA: Diagnosis not present

## 2019-10-12 ENCOUNTER — Telehealth (INDEPENDENT_AMBULATORY_CARE_PROVIDER_SITE_OTHER): Payer: Medicare HMO | Admitting: Cardiology

## 2019-10-12 ENCOUNTER — Other Ambulatory Visit: Payer: Self-pay

## 2019-10-12 VITALS — BP 140/80

## 2019-10-12 DIAGNOSIS — I10 Essential (primary) hypertension: Secondary | ICD-10-CM | POA: Diagnosis not present

## 2019-10-12 DIAGNOSIS — R6889 Other general symptoms and signs: Secondary | ICD-10-CM | POA: Diagnosis not present

## 2019-10-12 DIAGNOSIS — E782 Mixed hyperlipidemia: Secondary | ICD-10-CM

## 2019-10-12 NOTE — Progress Notes (Signed)
Patient referred by Steven Hahn, * for decreased exercise tolerance  Subjective:   Steven Hahn, male    DOB: October 11, 1950, 69 y.o.   MRN: [id number]  I connected with the patient on 10/12/2019 by a video enabled telemedicine application and verified that I am speaking with the correct person using two identifiers.     I discussed the limitations of evaluation and management by telemedicine and the availability of in person appointments. The patient expressed understanding and agreed to proceed.   This visit type was conducted due to national recommendations for restrictions regarding the COVID-19 Pandemic (e.g. social distancing).  This format is felt to be most appropriate for this patient at this time.  All issues noted in this document were discussed and addressed.  No physical exam was performed (except for noted visual exam findings with Tele health visits).  The patient has consented to conduct a Tele health visit and understands insurance will be billed.    Chief Complaint  Patient presents with  . Shortness of Breath     HPI  69 y.o. Caucasian male with hypertension, recent bilateral strokes, Hahn, referred for evaluation of decreased exercise tolerance.  Discussed echocardiogram, stress test, event monitor results with the patient. He denies chest pain, shortness of breath, palpitations, leg edema, orthopnea, PND, TIA/syncope.   Initial consultation HPI 09/01/2019: MRA head showed small bilateral infarcts. Intracranial MR angiography does not show any large or medium vessel occlusion or any correctable proximal stenosis. More distal branch vessels show atherosclerotic irregularity diffusely.  He was recommended 1 month DAPT, followed by Plavix, along with statin.  Echocardiogram was relatively normal, although agitated saline contrast study was not performed to look for interatrial shunt. He wore a monitor for 30 days, which was reportedly normal. I do not have  those records. However, patient reports he has noticed significant variations in his heart rate. He underwent neuro rehab and nearly regained all strength on left side.    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 He used to stay active prior to the stroke. However, further review suggests that he had had decreased exercise tolerance and exertional dyspnea even prior to the stroke. His blood pressure has been uncontrolled. He continues to smoke 1 PPD. He denies any chest pain.     Current Outpatient Medications on File Prior to Visit  Medication Sig Dispense Refill  . amLODipine (NORVASC) 5 MG tablet Take 1 tablet (5 mg total) by mouth daily. 30 tablet 3  . atorvastatin (LIPITOR) 80 MG tablet Take 1 tablet (80 mg total) by mouth daily at 6 PM. 30 tablet 12  . clopidogrel (PLAVIX) 75 MG tablet Take 1 tablet (75 mg total) by mouth daily. 30 tablet 11  . hydrochlorothiazide (HYDRODIURIL) 25 MG tablet Take 25 mg by mouth every morning.    Marland Kitchen lisinopril (ZESTRIL) 40 MG tablet Take 40 mg by mouth daily.     . metoprolol succinate (TOPROL-XL) 50 MG 24 hr tablet Take 100 mg by mouth daily. Take with or immediately following a meal.     . Tamsulosin HCl (FLOMAX) 0.4 MG CAPS Take 0.4 mg by mouth daily after supper.      No current facility-administered medications on file prior to visit.    Cardiovascular and other pertinent studies:  Event monitor 09/01/2019 - 09/30/2019: Diagnostic time: 99%  Dominant rhythm: Sinus. HR 48-149 bpm. Avg HR 71 bpm. No atrial fibrillation/atrial flutter/SVT/VT/high grade AV block, sinus pause >3sec noted. Symptoms reported:  None  Echocardiogram 10/03/2019:  Left ventricle cavity is normal in size and wall thickness. Normal global wall motion. Normal LV systolic function with EF 63%. Doppler evidence of grade I (impaired) diastolic dysfunction, normal LAP. Calculated EF 63%. Left atrial cavity is normal in size. Aneurysmal interatrial septum without 2D  or color Doppler evidence of interatrial shunt. Bubble study performed with no e/o right to left shunting noted.  Trileaflet aortic valve with no regurgitation noted. Mild aortic valve leaflet calcification. Structurally normal mitral valve.  Mild (Grade I) mitral regurgitation.  Lexiscan Tetrofosmin Stress Test  09/19/2019: Nondiagnostic ECG stress. Normal myocardial perfusion. All segments of left ventricle demonstrated normal wall motion and thickening. Stress LV EF is normal 56%.  No previous exam available for comparison. Low risk.   EKG 09/01/2019: Sinus rhythm 86 bpm. Left atrial enlargement. IVCD.  Echocardiogram 05/06/2019: EF 60-65%. Grade 1 DD. Mild to moderate aortic annular calcification.  No other abnormalities noted. Bubble study not performed.  MRA head 05/06/2019: Acute 9 mm infarction at the right lateral thalamus/posterior limb internal capsule.  Acute 5 mm infarction in the left lateral thalamus/posterior limb internal capsule.  Acute 4 mm infarction in the left basal ganglia.  Background pattern of extensive chronic small-vessel ischemic change elsewhere throughout the brain.  Neck MR angiography does not show any stenosis or occlusion of the neck vessels.  Intracranial MR angiography does not show any large or medium vessel occlusion or any correctable proximal stenosis. More distal branch vessels show atherosclerotic irregularity diffusely.  Multiple acute infarctions of this nature suggests that there could have been a generalized hypoperfusion event.   Recent labs: 05/07/2019: Glucose 93, BUN/Cr 10/0.72. EGFR >60. Na/K 140/3.9. Rest of the CMP normal H/H 15/44. MCV 87. Platelets 284 HbA1C 5.4% Chol 154, TG 84, HDL 31, LDL 106   Review of Systems  Constitution: Negative for decreased appetite, malaise/fatigue, weight gain and weight loss.  HENT: Negative for congestion.   Eyes: Negative for visual disturbance.  Cardiovascular: Positive  for dyspnea on exertion. Negative for chest pain, leg swelling, palpitations and syncope.  Respiratory: Negative for cough.   Endocrine: Negative for cold intolerance.  Hematologic/Lymphatic: Does not bruise/bleed easily.  Skin: Negative for itching and rash.  Musculoskeletal: Negative for myalgias.  Gastrointestinal: Negative for abdominal pain, nausea and vomiting.  Genitourinary: Negative for dysuria.  Neurological: Negative for dizziness and weakness.       Minimal weakness LUE LLE  Psychiatric/Behavioral: The patient is not nervous/anxious.   All other systems reviewed and are negative.        Vitals:   10/12/19 1332  BP: 140/80     Objective:   Physical Exam  Constitutional: He is oriented to person, place, and time. He appears well-developed and well-nourished. No distress.  Pulmonary/Chest: Effort normal.  Neurological: He is alert and oriented to person, place, and time.  Psychiatric: He has a normal mood and affect.  Nursing note and vitals reviewed.         Assessment & Recommendations:   69 y.o. Caucasian male with hypertension, recent bilateral strokes, Hahn, referred for evaluation of decreased exercise tolerance.  Decreased exercise tolerance, dyspnea on exertion: No ischemia on stress test. Structurally normal heart on echocardiogram. Suspect a component of COPD>  Bilateral strokes: No evidence of interatrial shunting on echocardiogram.  Hypertension: Controlled.   Hyperlipidemia: Continue crestor. Repeat lipid panel.   Tobacoc dependence: Re-emphasized importance of cessation. He is currently smoking 10 cigarettes/day and hopes to quit smoking soon.  Continue f/u w/PCP. F/u w/me in 1 year. Nigel Mormon, MD [facility] Pager: [phone] Office: [phone]

## 2019-11-04 DIAGNOSIS — R69 Illness, unspecified: Secondary | ICD-10-CM | POA: Diagnosis not present

## 2019-11-04 DIAGNOSIS — I10 Essential (primary) hypertension: Secondary | ICD-10-CM | POA: Diagnosis not present

## 2019-11-04 DIAGNOSIS — N400 Enlarged prostate without lower urinary tract symptoms: Secondary | ICD-10-CM | POA: Diagnosis not present

## 2019-11-04 DIAGNOSIS — I679 Cerebrovascular disease, unspecified: Secondary | ICD-10-CM | POA: Diagnosis not present

## 2019-11-04 DIAGNOSIS — E785 Hyperlipidemia, unspecified: Secondary | ICD-10-CM | POA: Diagnosis not present

## 2019-11-04 DIAGNOSIS — Z125 Encounter for screening for malignant neoplasm of prostate: Secondary | ICD-10-CM | POA: Diagnosis not present

## 2019-11-04 DIAGNOSIS — N529 Male erectile dysfunction, unspecified: Secondary | ICD-10-CM | POA: Diagnosis not present

## 2019-11-14 DIAGNOSIS — Z125 Encounter for screening for malignant neoplasm of prostate: Secondary | ICD-10-CM | POA: Diagnosis not present

## 2019-11-14 DIAGNOSIS — E785 Hyperlipidemia, unspecified: Secondary | ICD-10-CM | POA: Diagnosis not present

## 2019-11-14 DIAGNOSIS — I10 Essential (primary) hypertension: Secondary | ICD-10-CM | POA: Diagnosis not present

## 2019-11-28 DIAGNOSIS — G119 Hereditary ataxia, unspecified: Secondary | ICD-10-CM | POA: Diagnosis not present

## 2019-11-28 DIAGNOSIS — G890 Central pain syndrome: Secondary | ICD-10-CM | POA: Diagnosis not present

## 2019-11-28 DIAGNOSIS — I10 Essential (primary) hypertension: Secondary | ICD-10-CM | POA: Diagnosis not present

## 2019-11-28 DIAGNOSIS — I639 Cerebral infarction, unspecified: Secondary | ICD-10-CM | POA: Diagnosis not present

## 2020-01-12 DIAGNOSIS — R351 Nocturia: Secondary | ICD-10-CM | POA: Diagnosis not present

## 2020-01-12 DIAGNOSIS — R350 Frequency of micturition: Secondary | ICD-10-CM | POA: Diagnosis not present

## 2020-01-12 DIAGNOSIS — R3912 Poor urinary stream: Secondary | ICD-10-CM | POA: Diagnosis not present

## 2020-01-12 DIAGNOSIS — N401 Enlarged prostate with lower urinary tract symptoms: Secondary | ICD-10-CM | POA: Diagnosis not present

## 2020-01-12 DIAGNOSIS — R3915 Urgency of urination: Secondary | ICD-10-CM | POA: Diagnosis not present

## 2020-03-06 DIAGNOSIS — I10 Essential (primary) hypertension: Secondary | ICD-10-CM | POA: Diagnosis not present

## 2020-03-06 DIAGNOSIS — I872 Venous insufficiency (chronic) (peripheral): Secondary | ICD-10-CM | POA: Diagnosis not present

## 2020-03-06 DIAGNOSIS — R69 Illness, unspecified: Secondary | ICD-10-CM | POA: Diagnosis not present

## 2020-06-25 DIAGNOSIS — I679 Cerebrovascular disease, unspecified: Secondary | ICD-10-CM | POA: Diagnosis not present

## 2020-06-25 DIAGNOSIS — R69 Illness, unspecified: Secondary | ICD-10-CM | POA: Diagnosis not present

## 2020-06-25 DIAGNOSIS — E785 Hyperlipidemia, unspecified: Secondary | ICD-10-CM | POA: Diagnosis not present

## 2020-06-25 DIAGNOSIS — Z0000 Encounter for general adult medical examination without abnormal findings: Secondary | ICD-10-CM | POA: Diagnosis not present

## 2020-06-25 DIAGNOSIS — Z23 Encounter for immunization: Secondary | ICD-10-CM | POA: Diagnosis not present

## 2020-06-25 DIAGNOSIS — I10 Essential (primary) hypertension: Secondary | ICD-10-CM | POA: Diagnosis not present

## 2020-06-25 DIAGNOSIS — Z136 Encounter for screening for cardiovascular disorders: Secondary | ICD-10-CM | POA: Diagnosis not present

## 2020-06-25 DIAGNOSIS — N400 Enlarged prostate without lower urinary tract symptoms: Secondary | ICD-10-CM | POA: Diagnosis not present

## 2020-06-28 ENCOUNTER — Other Ambulatory Visit: Payer: Self-pay | Admitting: Family Medicine

## 2020-06-28 DIAGNOSIS — Z136 Encounter for screening for cardiovascular disorders: Secondary | ICD-10-CM

## 2020-07-04 ENCOUNTER — Ambulatory Visit: Payer: Medicare HMO

## 2020-07-04 ENCOUNTER — Ambulatory Visit
Admission: RE | Admit: 2020-07-04 | Discharge: 2020-07-04 | Disposition: A | Discharge status: Home / Self Care | Payer: Medicare HMO | Source: Ambulatory Visit | Attending: Family Medicine | Admitting: Family Medicine

## 2020-07-04 DIAGNOSIS — Z87891 Personal history of nicotine dependence: Secondary | ICD-10-CM | POA: Diagnosis not present

## 2020-07-04 DIAGNOSIS — Z136 Encounter for screening for cardiovascular disorders: Secondary | ICD-10-CM | POA: Diagnosis not present

## 2020-08-29 IMAGING — MR MRI HEAD WITHOUT CONTRAST
8 of 10 series · 32 of 48 positions shown · IV contrast (gadavist)
Comparison: CT 05/05/2019

CLINICAL DATA: Left-sided weakness and numbness with ataxia. Last
seen normal [DATE]/4747 0377 hours



[Series 2: T1 · sagittal · 5.0mm · 0.41mm/px · 3 of 20 slices shown (1 of 2)]
[im 1/20]
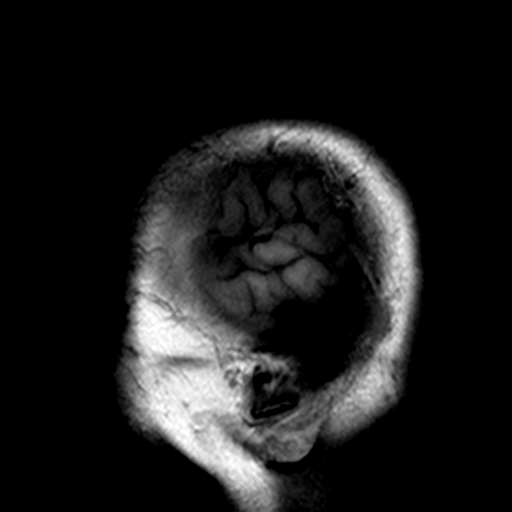
[im 10/20]
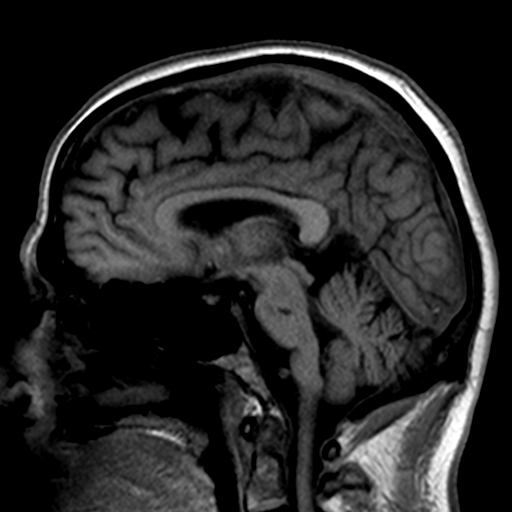
[im 20/20]
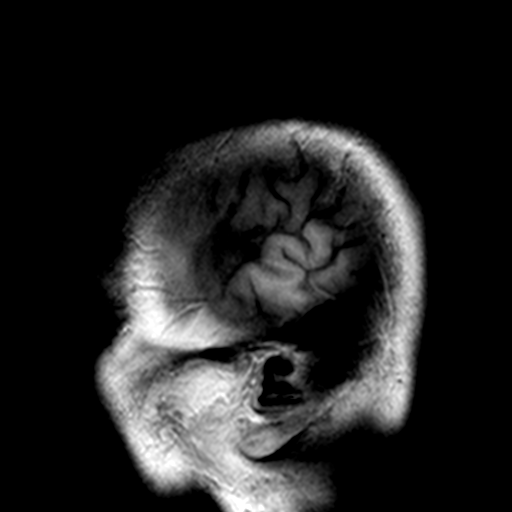

[Series 3: DWI · axial · 3.0mm · 0.71mm/px · z∈[-129,+32]mm · 7 of 55 slices shown (1 of 2)]
[im 1/55]
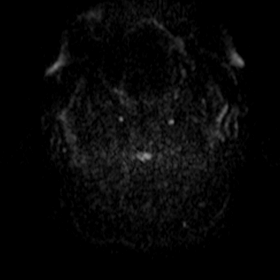
[im 10/55]
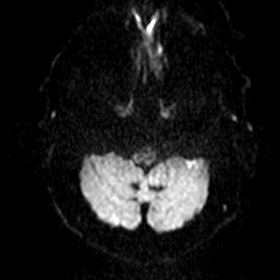
[im 19/55]
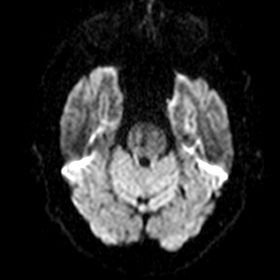
[im 28/55]
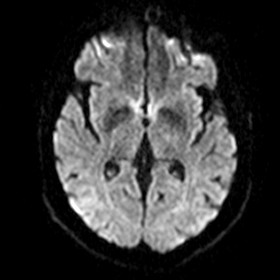
[im 37/55]
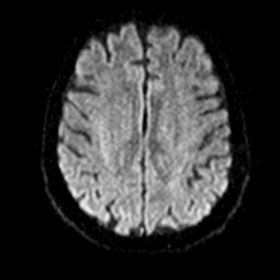
[im 46/55]
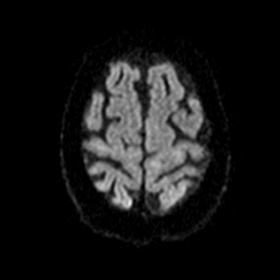
[im 55/55]
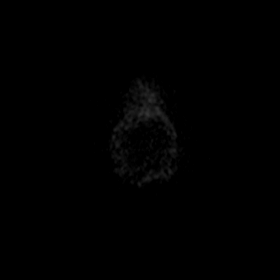

[Series 5: DWI · coronal · 5.0mm · 0.49mm/px · 4 of 34 slices shown (2 of 2)]
[im 1/34]
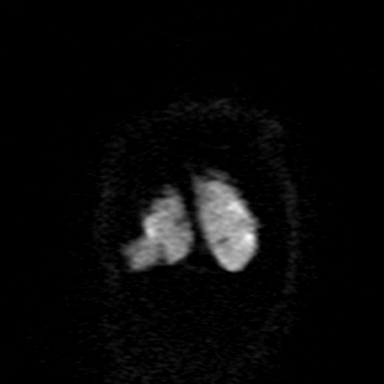
[im 12/34]
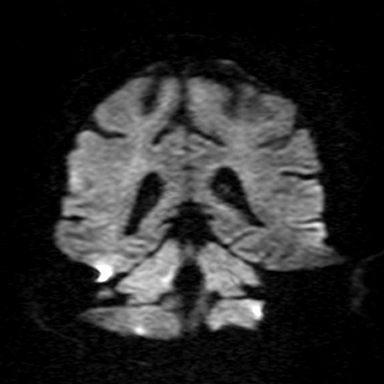
[im 23/34]
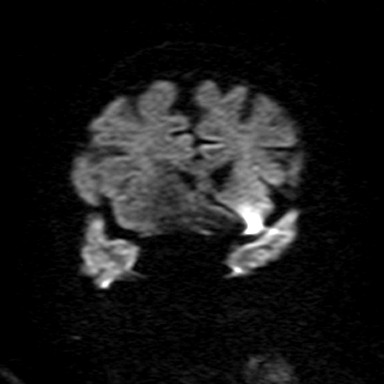
[im 34/34]
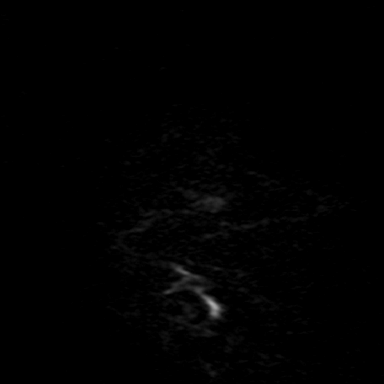

[Series 7: T2 · axial · 5.0mm · 0.63mm/px · z∈[-119,+23]mm · 3 of 23 slices shown (1 of 3)]
[im 1/23]
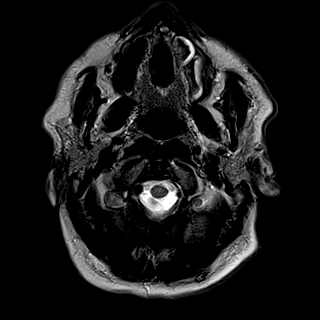
[im 12/23]
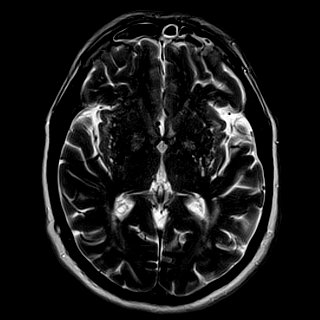
[im 23/23]
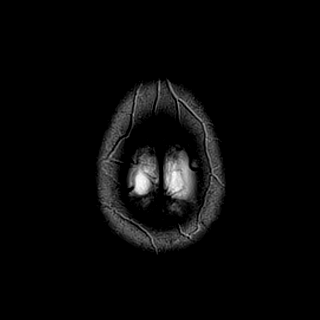

[Series 8: T2 · axial · 5.0mm · 0.42mm/px · z∈[-113,+16]mm · 2 of 21 slices shown (2 of 3)]
[im 1/21]
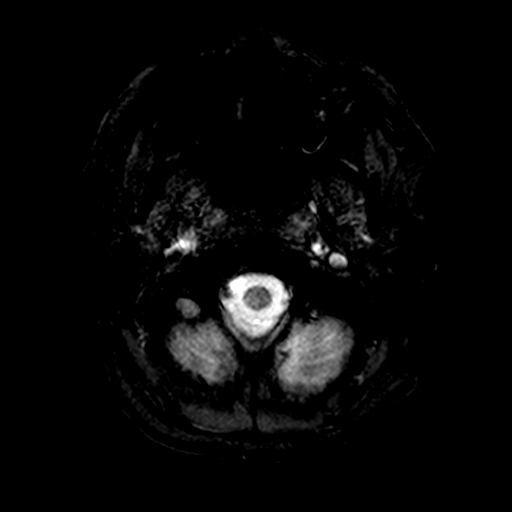
[im 21/21]
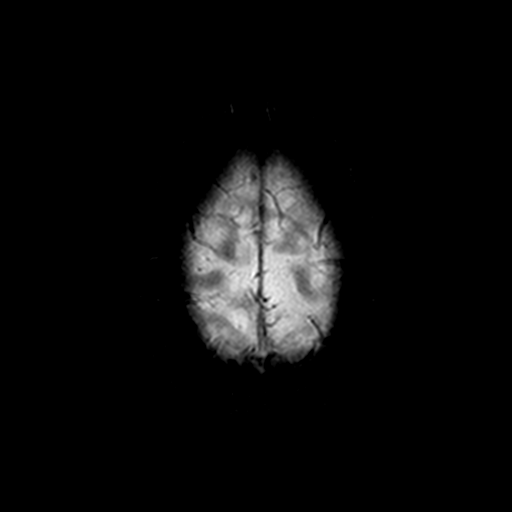

[Series 9: FLAIR · axial · 3.0mm · 0.84mm/px · z∈[-117,+20]mm · 5 of 47 slices shown]
[im 1/47]
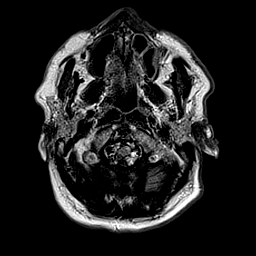
[im 12/47]
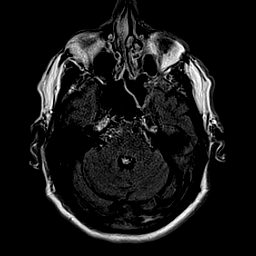
[im 24/47]
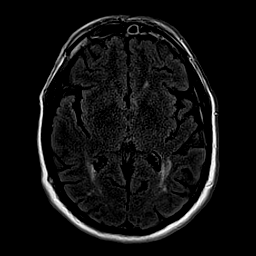
[im 35/47]
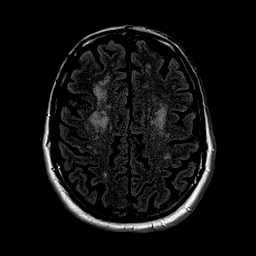
[im 47/47]
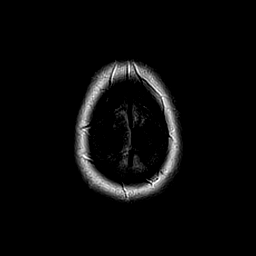

[Series 10: T1 · axial · 2.0mm · 0.41mm/px · z∈[-137,-22]mm · 5 of 98 slices shown (2 of 2)]
[im 1/98]
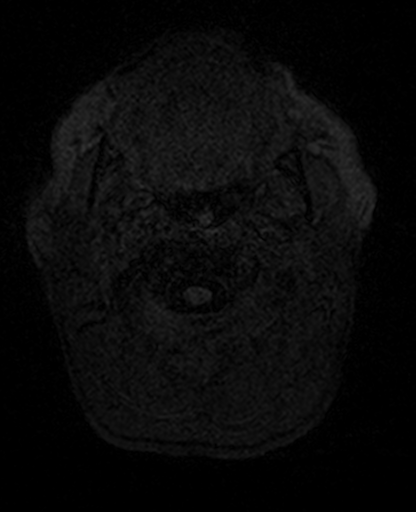
[im 20/98]
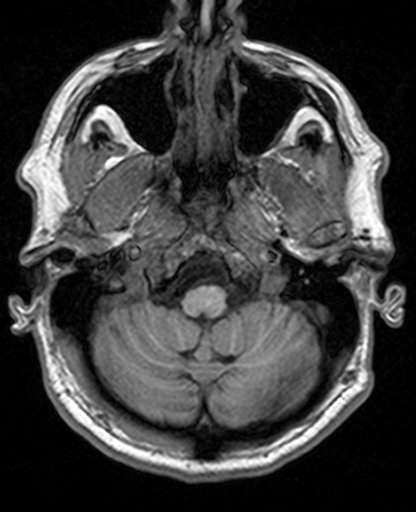
[im 30/98]
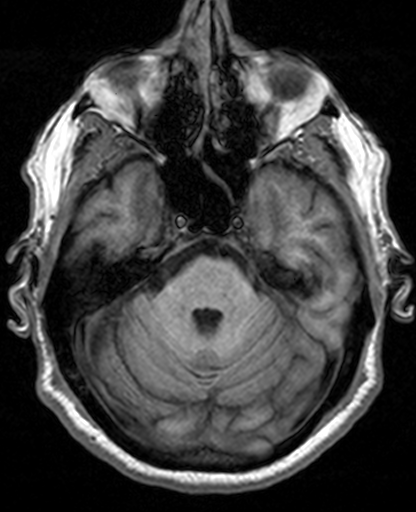
[im 39/98]
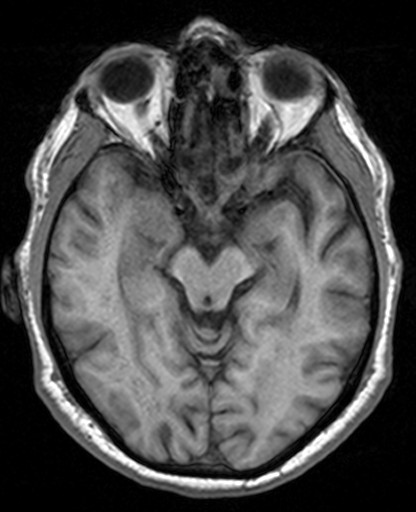
[im 59/98]
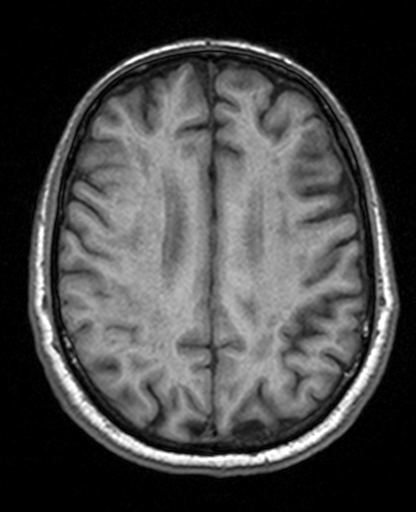

[Series 11: T2 · coronal · 5.0mm · 0.58mm/px · 3 of 28 slices shown (3 of 3)]
[im 1/28]
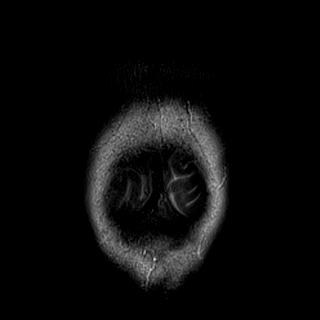
[im 14/28]
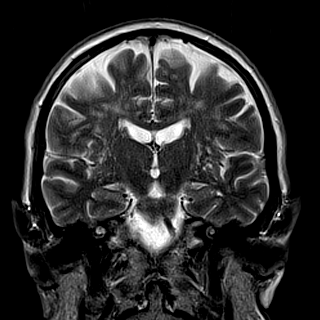
[im 28/28]
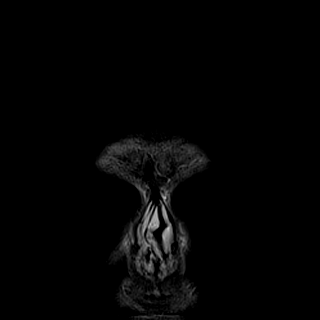

[32 of 48 positions shown; findings below may reference images not displayed]

FINDINGS: MRI HEAD FINDINGS

Brain: Diffusion imaging shows a 9 mm acute infarction at the right
lateral thalamus/posterior limb internal capsule and a 5 mm acute
infarction in the left lateral thalamus/posterior limb internal
capsule. 4 mm acute infarction in the left basal ganglia. No other
acute insult. Old small vessel infarctions in the pons, right worse
than left. No cerebellar abnormality. Cerebral hemispheres show old
small vessel infarctions of the thalami, basal ganglia and
throughout the cerebral hemispheric deep and subcortical white
matter. No large vessel territory infarction. No mass lesion,
hemorrhage, hydrocephalus or extra-axial collection.

Vascular: Major vessels at the base of the brain show flow.

Skull and upper cervical spine: Negative

Sinuses/Orbits: Mucosal inflammatory changes affecting the paranasal
sinuses. Orbits negative.

Other: None

MRA HEAD FINDINGS

Both internal carotid arteries are widely patent through the skull
base. Mild atherosclerotic irregularity in the carotid siphon
regions without flow limiting stenosis. The anterior and middle
cerebral vessels are patent without proximal stenosis, aneurysm or
vascular malformation. More distal branch vessels show some
atherosclerotic irregularity.

Right vertebral artery is a large vessel widely patent to the
basilar. Left vertebral artery is a small vessel that terminates in
PICA. Right PICA is patent as well. No basilar stenosis. Flow is
present in both superior cerebellar arteries in both posterior
cerebral arteries. Distal branch vessels show atherosclerotic
irregularity.

MRA NECK FINDINGS

Aortic arch appears normal. Branching pattern of the brachiocephalic
vessels is normal without origin stenosis. Both common carotid
arteries are widely patent to the bifurcation region. No carotid
bifurcation stenosis or irregularity. Cervical internal carotid
arteries appear normal.

Both vertebral arteries are patent. No origin stenosis suspected.
Both vertebral arteries appear normal through the cervical region to
the foramen magnum.
IMPRESSION: Acute 9 mm infarction at the right lateral thalamus/posterior limb
internal capsule.

Acute 5 mm infarction in the left lateral thalamus/posterior limb
internal capsule.

Acute 4 mm infarction in the left basal ganglia.

Background pattern of extensive chronic small-vessel ischemic change
elsewhere throughout the brain.

Neck MR angiography does not show any stenosis or occlusion of the
neck vessels.

Intracranial MR angiography does not show any large or medium vessel
occlusion or any correctable proximal stenosis. More distal branch
vessels show atherosclerotic irregularity diffusely.

Multiple acute infarctions of this nature suggests that there could
have been a generalized hypoperfusion event.

## 2020-08-29 IMAGING — MR MRA NECK WITHOUT AND WITH CONTRAST (IWTH CONTRAST)
4 of 7 series · 19 of 48 positions shown · IV contrast (gadavist)
Comparison: CT 05/05/2019

CLINICAL DATA: Left-sided weakness and numbness with ataxia. Last
seen normal [DATE]/4747 0377 hours



[Series 4: TOF · axial · 1.0mm · 0.43mm/px · z∈[-226,-124]mm · 9 of 104 slices shown]
[im 1/104]
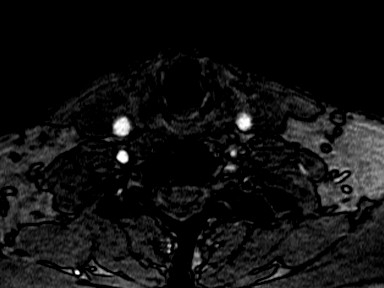
[im 18/104]
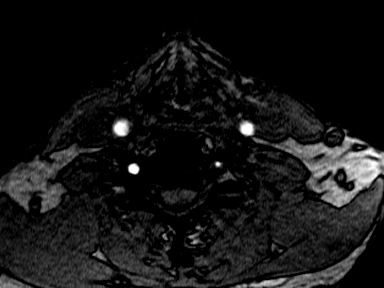
[im 35/104]
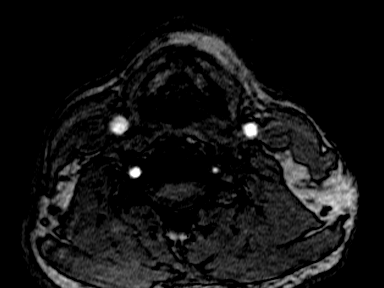
[im 43/104]
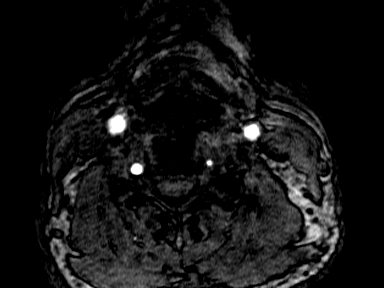
[im 52/104]
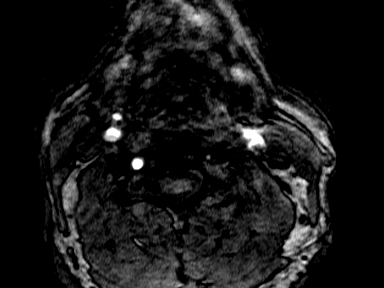
[im 61/104]
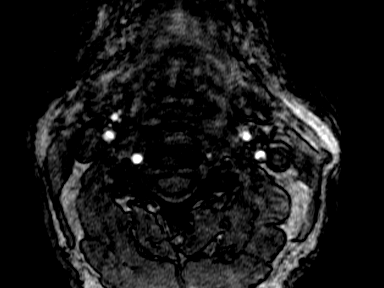
[im 69/104]
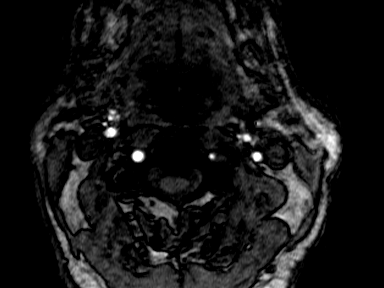
[im 86/104]
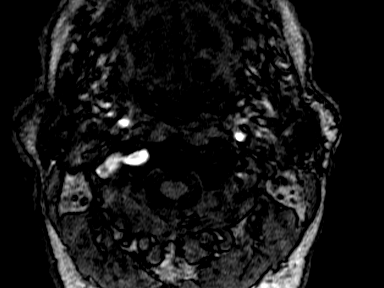
[im 104/104]
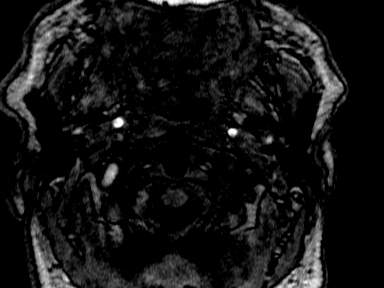

[Series 104: neck mask_fil_1 · coronal · 1.2mm · 0.45mm/px · 4 of 80 slices shown]
[im 1/80]
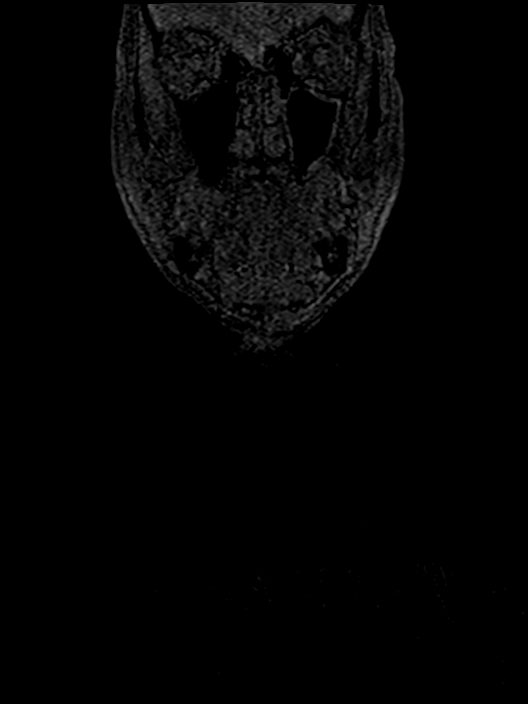
[im 8/80]
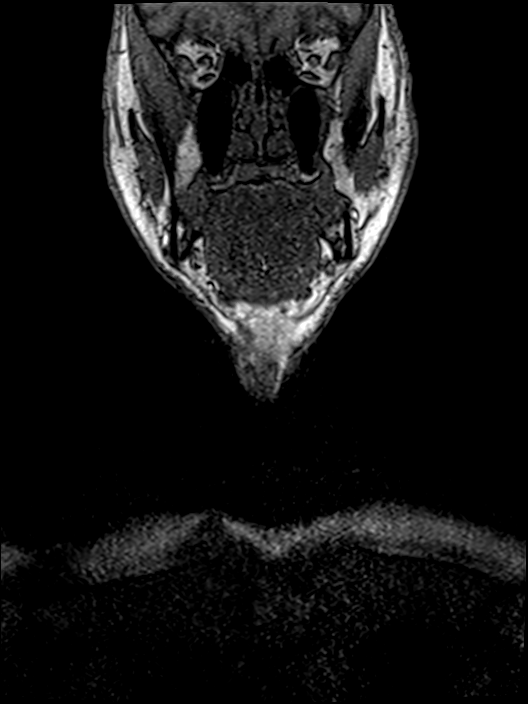
[im 40/80]
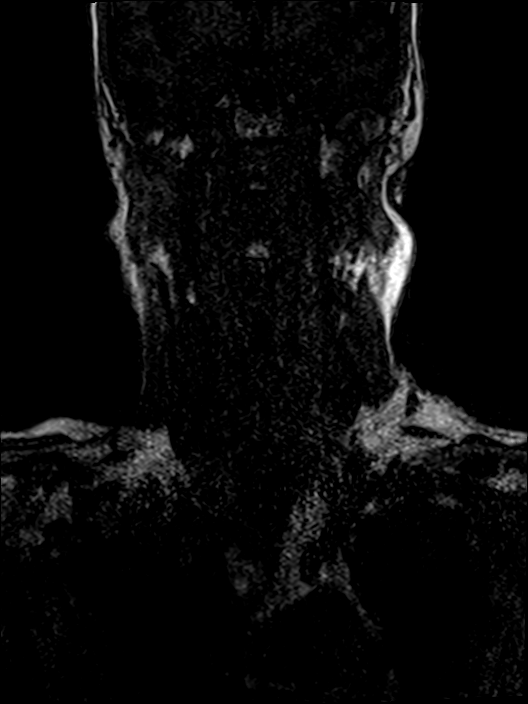
[im 72/80]
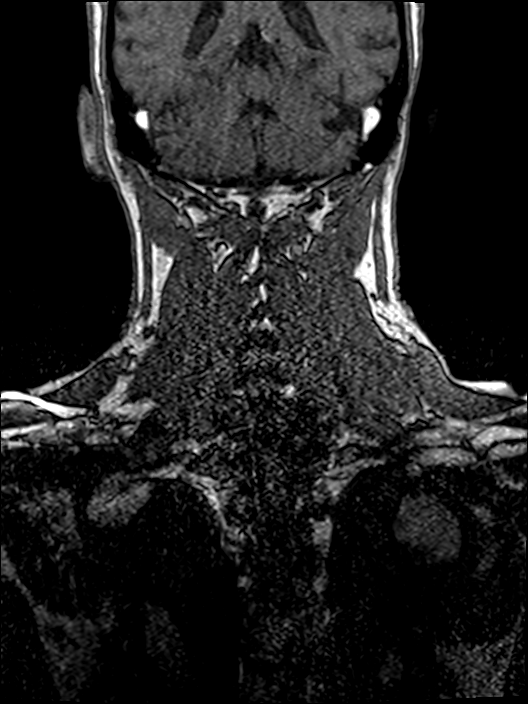

[Series 105: neck mra_fil_1 · coronal · 1.2mm · 0.45mm/px · 3 of 80 slices shown]
[im 8/80]
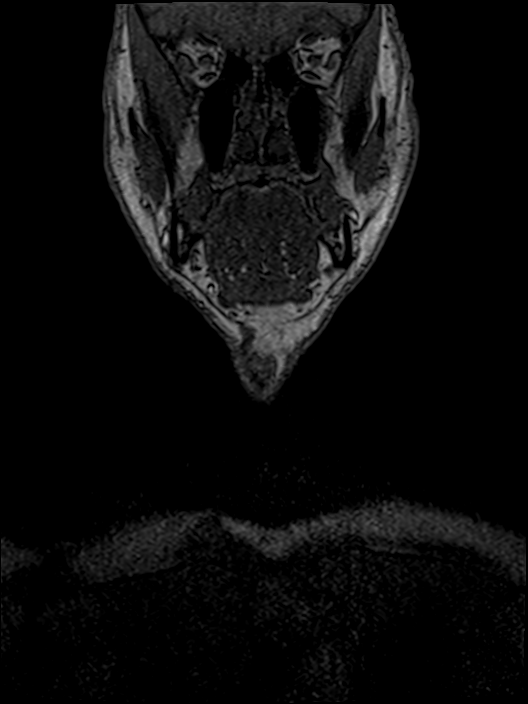
[im 40/80]
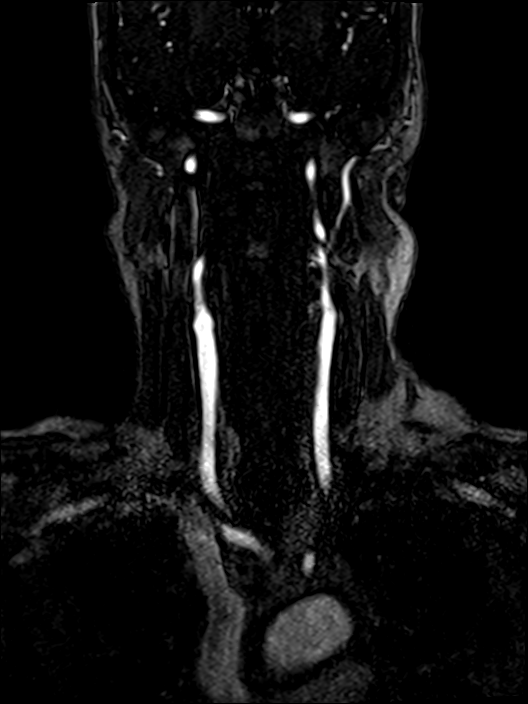
[im 72/80]
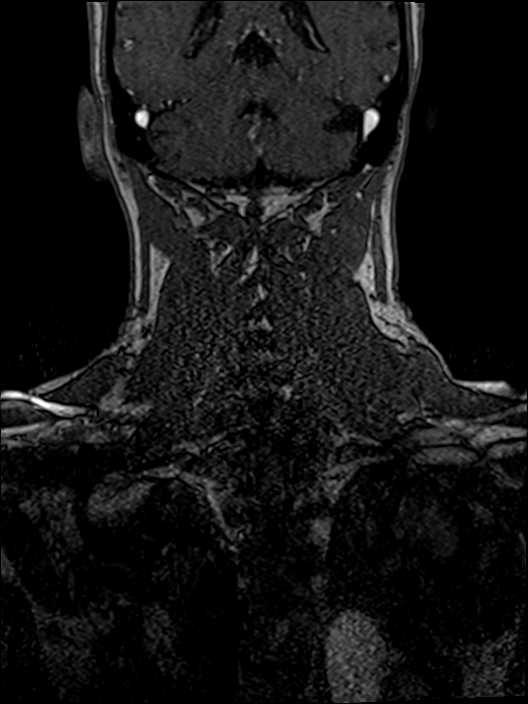

[Series 106: sub_s9-s7_1 · coronal · 1.2mm · 0.45mm/px · 3 of 73 slices shown]
[im 9/73]
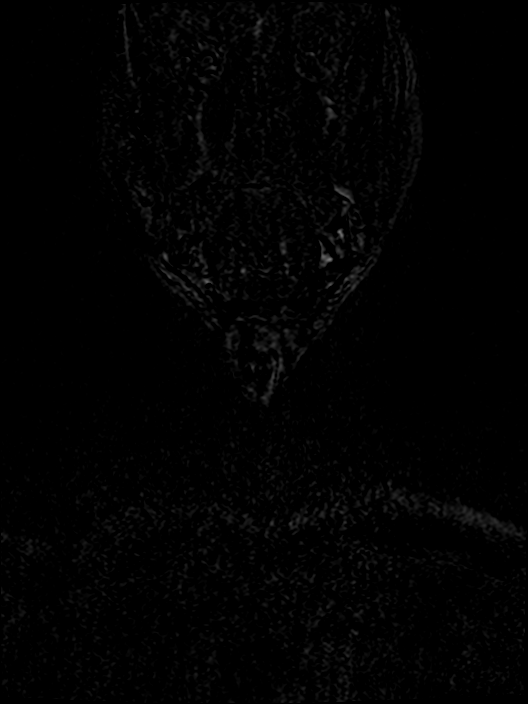
[im 41/73]
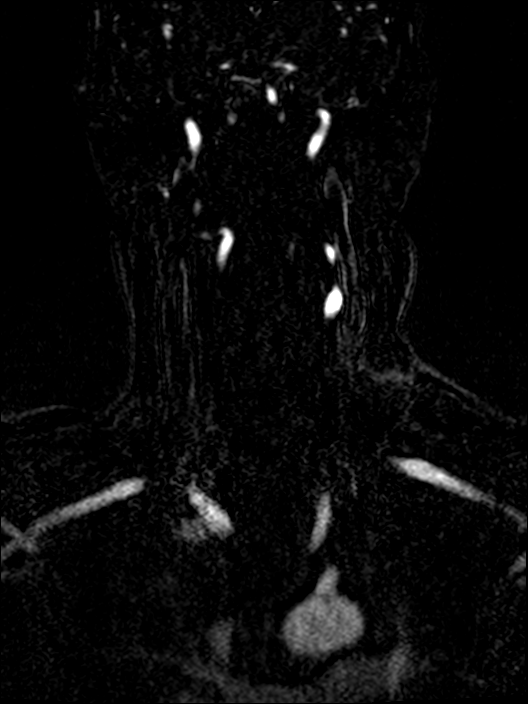
[im 65/73]
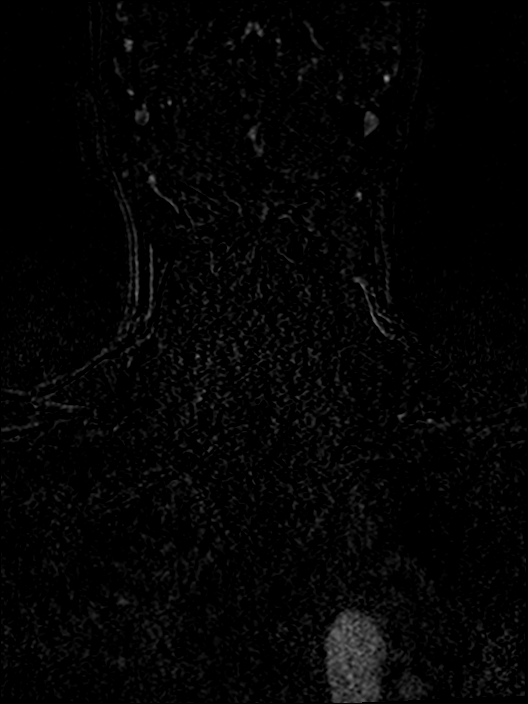

[19 of 48 positions shown; findings below may reference images not displayed]

FINDINGS: MRI HEAD FINDINGS

Brain: Diffusion imaging shows a 9 mm acute infarction at the right
lateral thalamus/posterior limb internal capsule and a 5 mm acute
infarction in the left lateral thalamus/posterior limb internal
capsule. 4 mm acute infarction in the left basal ganglia. No other
acute insult. Old small vessel infarctions in the pons, right worse
than left. No cerebellar abnormality. Cerebral hemispheres show old
small vessel infarctions of the thalami, basal ganglia and
throughout the cerebral hemispheric deep and subcortical white
matter. No large vessel territory infarction. No mass lesion,
hemorrhage, hydrocephalus or extra-axial collection.

Vascular: Major vessels at the base of the brain show flow.

Skull and upper cervical spine: Negative

Sinuses/Orbits: Mucosal inflammatory changes affecting the paranasal
sinuses. Orbits negative.

Other: None

MRA HEAD FINDINGS

Both internal carotid arteries are widely patent through the skull
base. Mild atherosclerotic irregularity in the carotid siphon
regions without flow limiting stenosis. The anterior and middle
cerebral vessels are patent without proximal stenosis, aneurysm or
vascular malformation. More distal branch vessels show some
atherosclerotic irregularity.

Right vertebral artery is a large vessel widely patent to the
basilar. Left vertebral artery is a small vessel that terminates in
PICA. Right PICA is patent as well. No basilar stenosis. Flow is
present in both superior cerebellar arteries in both posterior
cerebral arteries. Distal branch vessels show atherosclerotic
irregularity.

MRA NECK FINDINGS

Aortic arch appears normal. Branching pattern of the brachiocephalic
vessels is normal without origin stenosis. Both common carotid
arteries are widely patent to the bifurcation region. No carotid
bifurcation stenosis or irregularity. Cervical internal carotid
arteries appear normal.

Both vertebral arteries are patent. No origin stenosis suspected.
Both vertebral arteries appear normal through the cervical region to
the foramen magnum.
IMPRESSION: Acute 9 mm infarction at the right lateral thalamus/posterior limb
internal capsule.

Acute 5 mm infarction in the left lateral thalamus/posterior limb
internal capsule.

Acute 4 mm infarction in the left basal ganglia.

Background pattern of extensive chronic small-vessel ischemic change
elsewhere throughout the brain.

Neck MR angiography does not show any stenosis or occlusion of the
neck vessels.

Intracranial MR angiography does not show any large or medium vessel
occlusion or any correctable proximal stenosis. More distal branch
vessels show atherosclerotic irregularity diffusely.

Multiple acute infarctions of this nature suggests that there could
have been a generalized hypoperfusion event.

## 2020-09-28 ENCOUNTER — Encounter: Payer: Self-pay | Admitting: Physical Therapy

## 2020-09-28 NOTE — Therapy (Signed)
[facility] [address] [place], [place], [address] Phone: [phone]   Fax:  [phone]  Patient Details  Name: MCCALL LOMAX MRN: [id number] Date of Birth: Oct 03, 1950 Referring Provider:  No ref. provider found  Encounter Date: 09/28/2020  Discharge Summary  PHYSICAL THERAPY DISCHARGE SUMMARY  Visits from Start of Care: 13  Current functional level related to goals / functional outcomes: See most recent LTGs.   Remaining deficits: See last treatment note.   Education / Equipment: HEP  Plan: Patient agrees to discharge.  Patient goals were partially met. Patient is being discharged due to not returning since the last visit.  ?????      Arliss Journey, PT, DPT  09/28/2020, 11:55 AM  [facility] [address] [place], [place], [address] Phone: [phone]   Fax:  [phone]

## 2020-10-11 ENCOUNTER — Ambulatory Visit: Payer: Medicare HMO | Admitting: Cardiology

## 2020-10-17 ENCOUNTER — Other Ambulatory Visit: Payer: Self-pay

## 2020-10-17 ENCOUNTER — Ambulatory Visit: Payer: Medicare HMO | Admitting: Cardiology

## 2020-10-17 ENCOUNTER — Encounter: Payer: Self-pay | Admitting: Cardiology

## 2020-10-17 VITALS — BP 141/72 | HR 82 | Temp 98.4°F | Ht 66.0 in | Wt 155.0 lb

## 2020-10-17 DIAGNOSIS — I10 Essential (primary) hypertension: Secondary | ICD-10-CM

## 2020-10-17 DIAGNOSIS — E782 Mixed hyperlipidemia: Secondary | ICD-10-CM

## 2020-10-17 DIAGNOSIS — Z8673 Personal history of transient ischemic attack (TIA), and cerebral infarction without residual deficits: Secondary | ICD-10-CM

## 2020-10-17 DIAGNOSIS — R69 Illness, unspecified: Secondary | ICD-10-CM | POA: Diagnosis not present

## 2020-10-17 DIAGNOSIS — F17200 Nicotine dependence, unspecified, uncomplicated: Secondary | ICD-10-CM

## 2020-10-17 NOTE — Progress Notes (Signed)
Patient referred by [name], * for decreased exercise tolerance  Subjective:   Steven Hahn, male    DOB: 27-Aug-1951, 70 y.o.   MRN: [id number]   Chief Complaint  Patient presents with  . Cerebrovascular Accident  . Follow-up    HPI  70 y.o. Caucasian male with hypertension, hyperlipidemia, h/o bilateral strokes, tobacco dependence  Patient is doing well, denies chest pain, shortness of breath, palpitations, leg edema, orthopnea, PND, TIA/syncope. BP slightly elevated today. He continues to smoke 1/2 PPD. Reviewed recent labs with the patient, details below.   Initial consultation HPI 09/01/2019: MRA head showed small bilateral infarcts. Intracranial MR angiography does not show any large or medium vessel occlusion or any correctable proximal stenosis. More distal branch vessels show atherosclerotic irregularity diffusely.  He was recommended 1 month DAPT, followed by Plavix, along with statin.  Echocardiogram was relatively normal, although agitated saline contrast study was not performed to look for interatrial shunt. He wore a monitor for 30 days, which was reportedly normal. I do not have those records. However, patient reports he has noticed significant variations in his heart rate. He underwent neuro rehab and nearly regained all strength on left side.    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 He used to stay active prior to the stroke. However, further review suggests that he had had decreased exercise tolerance and exertional dyspnea even prior to the stroke. His blood pressure has been uncontrolled. He continues to smoke 1 PPD. He denies any chest pain.     Current Outpatient Medications on File Prior to Visit  Medication Sig Dispense Refill  . amLODipine (NORVASC) 5 MG tablet Take 1 tablet (5 mg total) by mouth daily. 30 tablet 3  . atorvastatin (LIPITOR) 80 MG tablet Take 1 tablet (80 mg total) by mouth daily at 6 PM. 30 tablet 12  .  hydrochlorothiazide (HYDRODIURIL) 25 MG tablet Take 25 mg by mouth every morning.    Marland Kitchen lisinopril (ZESTRIL) 40 MG tablet Take 40 mg by mouth daily.     . metoprolol succinate (TOPROL-XL) 50 MG 24 hr tablet Take 100 mg by mouth daily. Take with or immediately following a meal.    . Tamsulosin HCl (FLOMAX) 0.4 MG CAPS Take 0.4 mg by mouth daily after supper.     . clopidogrel (PLAVIX) 75 MG tablet Take 75 mg by mouth daily.     No current facility-administered medications on file prior to visit.    Cardiovascular and other pertinent studies:  EKG 10/17/2020: Sinus rhythm 68 bpm Poor R wave progression  Event monitor 09/01/2019 - 09/30/2019: Diagnostic time: 99%  Dominant rhythm: Sinus. HR 48-149 bpm. Avg HR 71 bpm. No atrial fibrillation/atrial flutter/SVT/VT/high grade AV block, sinus pause >3sec noted. Symptoms reported: None  Echocardiogram 10/03/2019:  Left ventricle cavity is normal in size and wall thickness. Normal global wall motion. Normal LV systolic function with EF 63%. Doppler evidence of grade I (impaired) diastolic dysfunction, normal LAP. Calculated EF 63%. Left atrial cavity is normal in size. Aneurysmal interatrial septum without 2D or color Doppler evidence of interatrial shunt. Bubble study performed with no e/o right to left shunting noted.  Trileaflet aortic valve with no regurgitation noted. Mild aortic valve leaflet calcification. Structurally normal mitral valve.  Mild (Grade I) mitral regurgitation.  Lexiscan Tetrofosmin Stress Test  09/19/2019: Nondiagnostic ECG stress. Normal myocardial perfusion. All segments of left ventricle demonstrated normal wall motion and thickening. Stress LV EF is normal 56%.  No previous exam available for comparison. Low risk.   EKG 09/01/2019: Sinus rhythm 86 bpm. Left atrial enlargement. IVCD.  Echocardiogram 05/06/2019: EF 60-65%. Grade 1 DD. Mild to moderate aortic annular calcification.  No other abnormalities  noted. Bubble study not performed.  MRA head 05/06/2019: Acute 9 mm infarction at the right lateral thalamus/posterior limb internal capsule.  Acute 5 mm infarction in the left lateral thalamus/posterior limb internal capsule.  Acute 4 mm infarction in the left basal ganglia.  Background pattern of extensive chronic small-vessel ischemic change elsewhere throughout the brain.  Neck MR angiography does not show any stenosis or occlusion of the neck vessels.  Intracranial MR angiography does not show any large or medium vessel occlusion or any correctable proximal stenosis. More distal branch vessels show atherosclerotic irregularity diffusely.  Multiple acute infarctions of this nature suggests that there could have been a generalized hypoperfusion event.   Recent labs: 11/14/2019: Glucose 81, BUN/Cr 7/0.9. EGFR 60. HbA1C 5.4% Chol 120, TG 130, HDL 31, LDL 65  05/07/2019: Glucose 93, BUN/Cr 10/0.72. EGFR >60. Na/K 140/3.9. Rest of the CMP normal H/H 15/44. MCV 87. Platelets 284 HbA1C 5.4% Chol 154, TG 84, HDL 31, LDL 106   Review of Systems  Constitutional: Negative for decreased appetite, malaise/fatigue, weight gain and weight loss.  HENT: Negative for congestion.   Eyes: Negative for visual disturbance.  Cardiovascular: Positive for dyspnea on exertion. Negative for chest pain, leg swelling, palpitations and syncope.  Respiratory: Negative for cough.   Endocrine: Negative for cold intolerance.  Hematologic/Lymphatic: Does not bruise/bleed easily.  Skin: Negative for itching and rash.  Musculoskeletal: Negative for myalgias.  Gastrointestinal: Negative for abdominal pain, nausea and vomiting.  Genitourinary: Negative for dysuria.  Neurological: Negative for dizziness and weakness.       Minimal weakness LUE LLE  Psychiatric/Behavioral: The patient is not nervous/anxious.   All other systems reviewed and are negative.        Vitals:   10/17/20 1041  BP:  (!) 141/72  Pulse: 82  Temp: 98.4 F (36.9 C)  SpO2: 97%     Objective:   Physical Exam Vitals and nursing note reviewed.  Constitutional:      General: He is not in acute distress.    Appearance: He is well-developed.  Pulmonary:     Effort: Pulmonary effort is normal.  Neurological:     Mental Status: He is alert and oriented to person, place, and time.           Assessment & Recommendations:   70 y.o. Caucasian male with hypertension, hyperlipidemia, h/o bilateral strokes, tobacco dependence  Bilateral strokes: No evidence of interatrial shunting on echocardiogram. Continue risk factor modification  Hypertension: Controlled.   Hyperlipidemia: Continue crestor.   [place] dependence: Re-emphasized importance of cessation. He is currently smoking 10 cigarettes/day and hopes to quit smoking soon.   Continue f/u w/PCP. I will see him as needed.  Nigel Mormon, MD [facility] Pager: [phone] Office: [phone]

## 2020-12-31 DIAGNOSIS — R69 Illness, unspecified: Secondary | ICD-10-CM | POA: Diagnosis not present

## 2020-12-31 DIAGNOSIS — I10 Essential (primary) hypertension: Secondary | ICD-10-CM | POA: Diagnosis not present

## 2020-12-31 DIAGNOSIS — N400 Enlarged prostate without lower urinary tract symptoms: Secondary | ICD-10-CM | POA: Diagnosis not present

## 2020-12-31 DIAGNOSIS — E785 Hyperlipidemia, unspecified: Secondary | ICD-10-CM | POA: Diagnosis not present

## 2021-06-26 DIAGNOSIS — I679 Cerebrovascular disease, unspecified: Secondary | ICD-10-CM | POA: Diagnosis not present

## 2021-06-26 DIAGNOSIS — Z0000 Encounter for general adult medical examination without abnormal findings: Secondary | ICD-10-CM | POA: Diagnosis not present

## 2021-06-26 DIAGNOSIS — Z23 Encounter for immunization: Secondary | ICD-10-CM | POA: Diagnosis not present

## 2021-06-26 DIAGNOSIS — R69 Illness, unspecified: Secondary | ICD-10-CM | POA: Diagnosis not present

## 2021-06-26 DIAGNOSIS — I700 Atherosclerosis of aorta: Secondary | ICD-10-CM | POA: Diagnosis not present

## 2021-06-26 DIAGNOSIS — I10 Essential (primary) hypertension: Secondary | ICD-10-CM | POA: Diagnosis not present

## 2021-06-26 DIAGNOSIS — E785 Hyperlipidemia, unspecified: Secondary | ICD-10-CM | POA: Diagnosis not present

## 2021-06-26 DIAGNOSIS — N400 Enlarged prostate without lower urinary tract symptoms: Secondary | ICD-10-CM | POA: Diagnosis not present

## 2021-07-02 DIAGNOSIS — Z23 Encounter for immunization: Secondary | ICD-10-CM | POA: Diagnosis not present

## 2021-11-04 DIAGNOSIS — H2513 Age-related nuclear cataract, bilateral: Secondary | ICD-10-CM | POA: Diagnosis not present

## 2021-11-04 DIAGNOSIS — Z0100 Encounter for examination of eyes and vision without abnormal findings: Secondary | ICD-10-CM | POA: Diagnosis not present

## 2022-07-10 DIAGNOSIS — R42 Dizziness and giddiness: Secondary | ICD-10-CM | POA: Diagnosis not present

## 2022-07-10 DIAGNOSIS — Z23 Encounter for immunization: Secondary | ICD-10-CM | POA: Diagnosis not present

## 2022-07-10 DIAGNOSIS — E785 Hyperlipidemia, unspecified: Secondary | ICD-10-CM | POA: Diagnosis not present

## 2022-07-10 DIAGNOSIS — I10 Essential (primary) hypertension: Secondary | ICD-10-CM | POA: Diagnosis not present

## 2022-07-10 DIAGNOSIS — Z0000 Encounter for general adult medical examination without abnormal findings: Secondary | ICD-10-CM | POA: Diagnosis not present

## 2022-07-10 DIAGNOSIS — R69 Illness, unspecified: Secondary | ICD-10-CM | POA: Diagnosis not present

## 2022-07-10 DIAGNOSIS — I679 Cerebrovascular disease, unspecified: Secondary | ICD-10-CM | POA: Diagnosis not present

## 2022-07-10 DIAGNOSIS — I700 Atherosclerosis of aorta: Secondary | ICD-10-CM | POA: Diagnosis not present

## 2022-07-10 DIAGNOSIS — N400 Enlarged prostate without lower urinary tract symptoms: Secondary | ICD-10-CM | POA: Diagnosis not present

## 2022-07-10 DIAGNOSIS — Z79899 Other long term (current) drug therapy: Secondary | ICD-10-CM | POA: Diagnosis not present

## 2022-07-10 DIAGNOSIS — R7301 Impaired fasting glucose: Secondary | ICD-10-CM | POA: Diagnosis not present

## 2023-01-12 DIAGNOSIS — I700 Atherosclerosis of aorta: Secondary | ICD-10-CM | POA: Diagnosis not present

## 2023-01-12 DIAGNOSIS — N400 Enlarged prostate without lower urinary tract symptoms: Secondary | ICD-10-CM | POA: Diagnosis not present

## 2023-01-12 DIAGNOSIS — E785 Hyperlipidemia, unspecified: Secondary | ICD-10-CM | POA: Diagnosis not present

## 2023-01-12 DIAGNOSIS — I679 Cerebrovascular disease, unspecified: Secondary | ICD-10-CM | POA: Diagnosis not present

## 2023-01-12 DIAGNOSIS — F17200 Nicotine dependence, unspecified, uncomplicated: Secondary | ICD-10-CM | POA: Diagnosis not present

## 2023-01-12 DIAGNOSIS — R7309 Other abnormal glucose: Secondary | ICD-10-CM | POA: Diagnosis not present

## 2023-01-12 DIAGNOSIS — I10 Essential (primary) hypertension: Secondary | ICD-10-CM | POA: Diagnosis not present

## 2023-08-05 DIAGNOSIS — D72829 Elevated white blood cell count, unspecified: Secondary | ICD-10-CM | POA: Diagnosis not present

## 2023-11-12 DIAGNOSIS — M5451 Vertebrogenic low back pain: Secondary | ICD-10-CM | POA: Diagnosis not present

## 2023-11-12 DIAGNOSIS — M5459 Other low back pain: Secondary | ICD-10-CM | POA: Diagnosis not present

## 2023-12-07 DIAGNOSIS — M5451 Vertebrogenic low back pain: Secondary | ICD-10-CM | POA: Diagnosis not present

## 2023-12-14 DIAGNOSIS — M5451 Vertebrogenic low back pain: Secondary | ICD-10-CM | POA: Diagnosis not present

## 2023-12-14 DIAGNOSIS — M51369 Other intervertebral disc degeneration, lumbar region without mention of lumbar back pain or lower extremity pain: Secondary | ICD-10-CM | POA: Diagnosis not present

## 2024-04-21 DIAGNOSIS — E785 Hyperlipidemia, unspecified: Secondary | ICD-10-CM | POA: Diagnosis not present

## 2024-04-21 DIAGNOSIS — I10 Essential (primary) hypertension: Secondary | ICD-10-CM | POA: Diagnosis not present

## 2024-04-21 DIAGNOSIS — N400 Enlarged prostate without lower urinary tract symptoms: Secondary | ICD-10-CM | POA: Diagnosis not present

## 2024-05-22 DIAGNOSIS — N400 Enlarged prostate without lower urinary tract symptoms: Secondary | ICD-10-CM | POA: Diagnosis not present

## 2024-05-22 DIAGNOSIS — E785 Hyperlipidemia, unspecified: Secondary | ICD-10-CM | POA: Diagnosis not present

## 2024-05-22 DIAGNOSIS — I10 Essential (primary) hypertension: Secondary | ICD-10-CM | POA: Diagnosis not present

## 2024-06-21 DIAGNOSIS — I10 Essential (primary) hypertension: Secondary | ICD-10-CM | POA: Diagnosis not present

## 2024-06-21 DIAGNOSIS — N400 Enlarged prostate without lower urinary tract symptoms: Secondary | ICD-10-CM | POA: Diagnosis not present

## 2024-06-21 DIAGNOSIS — E785 Hyperlipidemia, unspecified: Secondary | ICD-10-CM | POA: Diagnosis not present

## 2024-07-22 DIAGNOSIS — I10 Essential (primary) hypertension: Secondary | ICD-10-CM | POA: Diagnosis not present

## 2024-07-22 DIAGNOSIS — E785 Hyperlipidemia, unspecified: Secondary | ICD-10-CM | POA: Diagnosis not present

## 2024-07-22 DIAGNOSIS — N400 Enlarged prostate without lower urinary tract symptoms: Secondary | ICD-10-CM | POA: Diagnosis not present

## 2024-07-26 DIAGNOSIS — M25511 Pain in right shoulder: Secondary | ICD-10-CM | POA: Diagnosis not present

## 2024-07-26 DIAGNOSIS — M542 Cervicalgia: Secondary | ICD-10-CM | POA: Diagnosis not present

## 2024-08-21 DIAGNOSIS — I10 Essential (primary) hypertension: Secondary | ICD-10-CM | POA: Diagnosis not present

## 2024-08-21 DIAGNOSIS — N400 Enlarged prostate without lower urinary tract symptoms: Secondary | ICD-10-CM | POA: Diagnosis not present

## 2024-08-21 DIAGNOSIS — E785 Hyperlipidemia, unspecified: Secondary | ICD-10-CM | POA: Diagnosis not present
